# Patient Record
Sex: Female | Born: 1980 | Race: White | Hispanic: No | Smoking: Current every day smoker
Health system: Southern US, Community
[De-identification: ages and names within clinical notes are randomized; demographics above are authoritative.]

## PROBLEM LIST (undated history)

## (undated) HISTORY — PX: TUBAL LIGATION: SHX77

---

## 2002-04-17 ENCOUNTER — Emergency Department (HOSPITAL_COMMUNITY): Admission: EM | Admit: 2002-04-17 | Discharge: 2002-04-17 | Payer: Self-pay | Admitting: Emergency Medicine

## 2002-05-26 ENCOUNTER — Emergency Department (HOSPITAL_COMMUNITY): Admission: EM | Admit: 2002-05-26 | Discharge: 2002-05-26 | Payer: Self-pay | Admitting: Emergency Medicine

## 2004-07-11 ENCOUNTER — Emergency Department (HOSPITAL_COMMUNITY): Admission: EM | Admit: 2004-07-11 | Discharge: 2004-07-11 | Payer: Self-pay | Admitting: Emergency Medicine

## 2007-06-12 ENCOUNTER — Emergency Department (HOSPITAL_COMMUNITY): Admission: EM | Admit: 2007-06-12 | Discharge: 2007-06-12 | Payer: Self-pay | Admitting: Emergency Medicine

## 2009-01-12 ENCOUNTER — Emergency Department (HOSPITAL_COMMUNITY): Admission: EM | Admit: 2009-01-12 | Discharge: 2009-01-12 | Payer: Self-pay | Admitting: Emergency Medicine

## 2009-04-13 ENCOUNTER — Emergency Department (HOSPITAL_COMMUNITY): Admission: EM | Admit: 2009-04-13 | Discharge: 2009-04-13 | Payer: Self-pay | Admitting: Emergency Medicine

## 2009-11-30 ENCOUNTER — Emergency Department (HOSPITAL_COMMUNITY): Admission: EM | Admit: 2009-11-30 | Discharge: 2009-11-30 | Payer: Self-pay | Admitting: Emergency Medicine

## 2010-09-04 ENCOUNTER — Emergency Department (HOSPITAL_COMMUNITY)
Admission: EM | Admit: 2010-09-04 | Discharge: 2010-09-04 | Disposition: A | Discharge status: Home / Self Care | Payer: Self-pay | Attending: Emergency Medicine | Admitting: Emergency Medicine

## 2010-09-04 DIAGNOSIS — L255 Unspecified contact dermatitis due to plants, except food: Secondary | ICD-10-CM | POA: Insufficient documentation

## 2010-09-04 DIAGNOSIS — R21 Rash and other nonspecific skin eruption: Secondary | ICD-10-CM | POA: Insufficient documentation

## 2010-12-24 LAB — DIFFERENTIAL
Basophils Absolute: 0
Basophils Relative: 0
Neutro Abs: 5.5
Neutrophils Relative %: 77

## 2010-12-24 LAB — CBC
MCHC: 34.5
Platelets: 258
RDW: 13.1

## 2011-07-16 ENCOUNTER — Emergency Department (HOSPITAL_COMMUNITY)
Admission: EM | Admit: 2011-07-16 | Discharge: 2011-07-16 | Disposition: A | Discharge status: Home / Self Care | Payer: Self-pay | Attending: Emergency Medicine | Admitting: Emergency Medicine

## 2011-07-16 ENCOUNTER — Encounter (HOSPITAL_COMMUNITY): Payer: Self-pay | Admitting: *Deleted

## 2011-07-16 DIAGNOSIS — J40 Bronchitis, not specified as acute or chronic: Secondary | ICD-10-CM

## 2011-07-16 DIAGNOSIS — R05 Cough: Secondary | ICD-10-CM | POA: Insufficient documentation

## 2011-07-16 DIAGNOSIS — R059 Cough, unspecified: Secondary | ICD-10-CM | POA: Insufficient documentation

## 2011-07-16 DIAGNOSIS — F17200 Nicotine dependence, unspecified, uncomplicated: Secondary | ICD-10-CM | POA: Insufficient documentation

## 2011-07-16 NOTE — Discharge Instructions (Signed)
Bronchitis Bronchitis is the body's way of reacting to injury and/or infection (inflammation) of the bronchi. Bronchi are the air tubes that extend from the windpipe into the lungs. If the inflammation becomes severe, it may cause shortness of breath. CAUSES  Inflammation may be caused by:  A virus.   Germs (bacteria).   Dust.   Allergens.   Pollutants and many other irritants.  The cells lining the bronchial tree are covered with tiny hairs (cilia). These constantly beat upward, away from the lungs, toward the mouth. This keeps the lungs free of pollutants. When these cells become too irritated and are unable to do their job, mucus begins to develop. This causes the characteristic cough of bronchitis. The cough clears the lungs when the cilia are unable to do their job. Without either of these protective mechanisms, the mucus would settle in the lungs. Then you would develop pneumonia. Smoking is a common cause of bronchitis and can contribute to pneumonia. Stopping this habit is the single most important thing you can do to help yourself. TREATMENT   Your caregiver may prescribe an antibiotic if the cough is caused by bacteria. Also, medicines that open up your airways make it easier to breathe. Your caregiver may also recommend or prescribe an expectorant. It will loosen the mucus to be coughed up. Only take over-the-counter or prescription medicines for pain, discomfort, or fever as directed by your caregiver.   Removing whatever causes the problem (smoking, for example) is critical to preventing the problem from getting worse.   Cough suppressants may be prescribed for relief of cough symptoms.   Inhaled medicines may be prescribed to help with symptoms now and to help prevent problems from returning.   For those with recurrent (chronic) bronchitis, there may be a need for steroid medicines.  SEEK IMMEDIATE MEDICAL CARE IF:   During treatment, you develop more pus-like mucus  (purulent sputum).   You have a fever.   Your baby is older than 3 months with a rectal temperature of 102 F (38.9 C) or higher.   Your baby is 3 months old or younger with a rectal temperature of 100.4 F (38 C) or higher.   You become progressively more ill.   You have increased difficulty breathing, wheezing, or shortness of breath.  It is necessary to seek immediate medical care if you are elderly or sick from any other disease. MAKE SURE YOU:   Understand these instructions.   Will watch your condition.   Will get help right away if you are not doing well or get worse.  Document Released: 03/18/2005 Document Revised: 03/07/2011 Document Reviewed: 01/26/2008 Exit[employer] Patient Information 2012 [employer].  Antibiotic Nonuse  Your caregiver felt that the infection or problem was not one that would be helped with an antibiotic. Infections may be caused by viruses or bacteria. Only a caregiver can tell which one of these is the likely cause of an illness. A cold is the most common cause of infection in both adults and children. A cold is a virus. Antibiotic treatment will have no effect on a viral infection. Viruses can lead to many lost days of work caring for sick children and many missed days of school. Children may catch as many as 10 "colds" or "flus" per year during which they can be tearful, cranky, and uncomfortable. The goal of treating a virus is aimed at keeping the ill person comfortable. Antibiotics are medications used to help the body fight bacterial infections. There are   relatively few types of bacteria that cause infections but there are hundreds of viruses. While both viruses and bacteria cause infection they are very different types of germs. A viral infection will typically go away by itself within 7 to 10 days. Bacterial infections may spread or get worse without antibiotic treatment. Examples of bacterial infections are:  Sore throats (like strep throat or  tonsillitis).   Infection in the lung (pneumonia).   Ear and skin infections.  Examples of viral infections are:  Colds or flus.   Most coughs and bronchitis.   Sore throats not caused by Strep.   Runny noses.  It is often best not to take an antibiotic when a viral infection is the cause of the problem. Antibiotics can kill off the helpful bacteria that we have inside our body and allow harmful bacteria to start growing. Antibiotics can cause side effects such as allergies, nausea, and diarrhea without helping to improve the symptoms of the viral infection. Additionally, repeated uses of antibiotics can cause bacteria inside of our body to become resistant. That resistance can be passed onto harmful bacterial. The next time you have an infection it may be harder to treat if antibiotics are used when they are not needed. Not treating with antibiotics allows our own immune system to develop and take care of infections more efficiently. Also, antibiotics will work better for us when they are prescribed for bacterial infections. Treatments for a child that is ill may include:  Give extra fluids throughout the day to stay hydrated.   Get plenty of rest.   Only give your child over-the-counter or prescription medicines for pain, discomfort, or fever as directed by your caregiver.   The use of a cool mist humidifier may help stuffy noses.   Cold medications if suggested by your caregiver.  Your caregiver may decide to start you on an antibiotic if:  The problem you were seen for today continues for a longer length of time than expected.   You develop a secondary bacterial infection.  SEEK MEDICAL CARE IF:  Fever lasts longer than 5 days.   Symptoms continue to get worse after 5 to 7 days or become severe.   Difficulty in breathing develops.   Signs of dehydration develop (poor drinking, rare urinating, dark colored urine).   Changes in behavior or worsening tiredness (listlessness  or lethargy).  Document Released: 05/27/2001 Document Revised: 03/07/2011 Document Reviewed: 11/23/2008 [employer] Patient Information 2012 [employer]. 

## 2011-07-16 NOTE — ED Provider Notes (Signed)
Medical screening examination/treatment/procedure(s) were performed by non-physician practitioner and as supervising physician I was immediately available for consultation/collaboration.  Elesia Pemberton, MD 07/16/11 1457 

## 2011-07-16 NOTE — ED Provider Notes (Signed)
History     CSN: [id number]  Arrival date & time 07/16/11  6962   First MD Initiated Contact with Kendra Huffman 07/16/11 [phone]      Chief Complaint  Kendra Huffman presents with  . Cough    (Consider location/radiation/quality/duration/timing/severity/associated sxs/prior treatment) The history is provided by the Kendra Huffman.  31 y/o F pt with no hx chronic lung disease but a 1ppd smoking habit presents to ED with c/c of "wet" non-prod cough x 2 weeks. Assoc with nasal congestion,sore throat, intermittent mild HA. Today, began to have soreness to the entire chest, worse with deep breathing and palpation. There has been no fever, chills, chest tightness, SOB, abd pain, N/V, or myalgias. Pt reports hx of bronchitis each year that typically resolves spontaneously after several weeks. Was advised by employer to come to ED for evaluation of her ability to work today. Smoking makes symptoms worse.  Has been taking cough syrup with minimal relief.  History reviewed. No pertinent past medical history.  Past Surgical History  Procedure Date  . Cesarean section     x 2    No family history on file.  History  Substance Use Topics  . Smoking status: Current Everyday Smoker -- 1.0 packs/day  . Smokeless tobacco: Not on file  . Alcohol Use: No     Review of Systems 10 systems reviewed and are negative for acute change except as noted in the HPI.  Allergies  Review of Kendra Huffman's allergies indicates not on file.  Home Medications  No current outpatient prescriptions on file.  BP 121/88  Pulse 69  Temp(Src) 98.6 F (37 C) (Oral)  Resp 17  Wt 176 lb (79.833 kg)  SpO2 99%  LMP 07/10/2011  Physical Exam  Nursing note and vitals reviewed. Constitutional: She is oriented to person, place, and time. She appears well-developed and well-nourished. No distress.  HENT:  Head: Normocephalic and atraumatic.  Right Ear: Hearing, tympanic membrane, external ear and ear canal normal.  Left Ear: Hearing,  tympanic membrane, external ear and ear canal normal.  Nose: Right sinus exhibits no maxillary sinus tenderness and no frontal sinus tenderness. Left sinus exhibits no maxillary sinus tenderness and no frontal sinus tenderness.  Mouth/Throat: Uvula is midline, oropharynx is clear and moist and mucous membranes are normal. No uvula swelling.       Purulent nasal discharge seen in both nares.  Eyes: Conjunctivae are normal. Pupils are equal, round, and reactive to light. Right eye exhibits no discharge. Left eye exhibits no discharge.  Neck: Normal range of motion. Neck supple.  Cardiovascular: Normal rate, regular rhythm, normal heart sounds and intact distal pulses.   Pulmonary/Chest: Effort normal and breath sounds normal. No respiratory distress. She has no wheezes. She has no rales.       Mild TTP to bilateral upper chest wall  Abdominal: Soft. Bowel sounds are normal. She exhibits no distension. There is no tenderness.  Musculoskeletal: She exhibits no edema and no tenderness.  Lymphadenopathy:    She has no cervical adenopathy.  Neurological: She is alert and oriented to person, place, and time. No cranial nerve deficit.       GCS 15. Normal speech.  Skin: Skin is warm and dry.  Psychiatric: She has a normal mood and affect.    ED Course  Procedures (including critical care time)  Labs Reviewed - No data to display No results found.  Dx 1: Bronchitis   MDM  AF, VSS. Bronchitis sx x 2 weeks in pt with  no chronic lung disease. VS, examination, and PMH not c/w ACS, pneumonia, pneumothorax, PE. Discussed symptomatic tx with pt. No indication for albuterol administration. She is allergic to all medications with codeine and will continue using OTC cough medication as needed. Advised ibuprofen for discomfort. Return precautions discussed.        Shaaron Adler, PA-C 07/16/11 1610  Shaaron Adler, PA-C 07/16/11 [phone]

## 2011-07-16 NOTE — ED Notes (Signed)
Pt states "I've had a cough for 2 wks but can't cough it up, when I blow my nose, it's yellow/brown, couldn't even smoke a cigarette this morning, my chest feels sore"

## 2011-07-29 ENCOUNTER — Emergency Department (HOSPITAL_COMMUNITY)
Admission: EM | Admit: 2011-07-29 | Discharge: 2011-07-29 | Disposition: A | Discharge status: Home / Self Care | Payer: Self-pay | Attending: Emergency Medicine | Admitting: Emergency Medicine

## 2011-07-29 ENCOUNTER — Encounter (HOSPITAL_COMMUNITY): Payer: Self-pay | Admitting: Emergency Medicine

## 2011-07-29 DIAGNOSIS — F17200 Nicotine dependence, unspecified, uncomplicated: Secondary | ICD-10-CM | POA: Insufficient documentation

## 2011-07-29 DIAGNOSIS — L03116 Cellulitis of left lower limb: Secondary | ICD-10-CM

## 2011-07-29 DIAGNOSIS — L02419 Cutaneous abscess of limb, unspecified: Secondary | ICD-10-CM | POA: Insufficient documentation

## 2011-07-29 DIAGNOSIS — L03119 Cellulitis of unspecified part of limb: Secondary | ICD-10-CM | POA: Insufficient documentation

## 2011-07-29 MED ORDER — AMOXICILLIN 500 MG PO CAPS: 500.0000 mg | ORAL_CAPSULE | Freq: Three times a day (TID) | ORAL | Status: DC

## 2011-07-29 MED ORDER — AMOXICILLIN 500 MG PO CAPS: 500.0000 mg | ORAL_CAPSULE | Freq: Three times a day (TID) | ORAL | Status: AC

## 2011-07-29 NOTE — ED Notes (Signed)
Pt alert, nad, c/o raised reddened area to inner left thigh, 2cm, non open, pinpoint center, reddened area noted, area is not firm, nor warm to touch, pt states area "itches"

## 2011-07-29 NOTE — ED Notes (Signed)
Pt c/o wound to L thigh, pt states wound has dark center with reddened area circling, no known insect bite. Nausea last night.

## 2011-07-29 NOTE — ED Provider Notes (Signed)
History     CSN: [id number]  Arrival date & time 07/29/11  0531   First MD Initiated Contact with Patient 07/29/11 0600      Chief Complaint  Patient presents with  . Wound Check    (Consider location/radiation/quality/duration/timing/severity/associated sxs/prior treatment) HPI Patient presents emergency department with the area of redness to her left upper thigh.  She states that she did not notice any ticks or bugs that would have bitten her.  Patient denies fever, nausea/vomiting, diarrhea, weakness, or headache.  Patient has not tried any thing to alleviate the symptoms.  She states nothing seems to make the area, worse.  She said she noted.  2 upper thigh 2 days ago. History reviewed. No pertinent past medical history.  Past Surgical History  Procedure Date  . Cesarean section     x 2    No family history on file.  History  Substance Use Topics  . Smoking status: Current Everyday Smoker -- 1.0 packs/day  . Smokeless tobacco: Not on file  . Alcohol Use: No    OB History    Grav Para Term Preterm Abortions TAB SAB Ect Mult Living                  Review of Systems All other systems negative except as documented in the HPI. All pertinent positives and negatives as reviewed in the HPI.  Allergies  Codeine  Home Medications  No current outpatient prescriptions on file.  BP 130/78  Pulse 82  Temp 98.1 F (36.7 C)  Resp 18  Ht 5\' 5"  (1.651 m)  Wt 174 lb (78.926 kg)  BMI 28.96 kg/m2  SpO2 99%  LMP 07/10/2011  Physical Exam Physical Examination: General appearance - alert, well appearing, and in no distress, oriented to person, place, and time and normal appearing weight Lymphatics - no palpable lymphadenopathy Chest - clear to auscultation, no wheezes, rales or rhonchi, symmetric air entry, no tachypnea, retractions or cyanosis Heart - normal rate, regular rhythm, normal S1, S2, no murmurs, rubs, clicks or gallops Extremities - peripheral pulses normal,  no pedal edema, no clubbing or cyanosis Skin - The patient has a punctate wound in the medial mid L thigh. There is an outer ring of redness noted around the central darker red area.  ED Course  Procedures (including critical care time)  The patient will be treated as a cellulitis versus tick bite. I am going to use amoxicillin for 28 days just to cover for tick bite possibility and since she is self pay doxy was not used. There is no area that appears to be forming an abscess. The central wound looks like there was a bite to this area. There is a 'target" type lesion so therefore the treatment was chosen this way.  The patient is advised to return here in 2 days for a recheck or sooner for any worsening. Told to use heat on the area.   MDM          Carlyle Dolly, PA-C 07/29/11 4098  Carlyle Dolly, PA-C 07/29/11 [phone]

## 2011-07-29 NOTE — Discharge Instructions (Signed)
Return here in 2 days for a recheck or sooner if the area worsens. Use heat on the area as well.

## 2011-07-29 NOTE — ED Provider Notes (Signed)
Medical screening examination/treatment/procedure(s) were performed by non-physician practitioner and as supervising physician I was immediately available for consultation/collaboration.  Olivia Mackie, MD 07/29/11 [phone]

## 2012-12-23 ENCOUNTER — Encounter (HOSPITAL_COMMUNITY): Payer: Self-pay | Admitting: Emergency Medicine

## 2012-12-23 ENCOUNTER — Emergency Department (HOSPITAL_COMMUNITY)
Admission: EM | Admit: 2012-12-23 | Discharge: 2012-12-23 | Disposition: A | Discharge status: Home / Self Care | Payer: Self-pay | Attending: Emergency Medicine | Admitting: Emergency Medicine

## 2012-12-23 DIAGNOSIS — K029 Dental caries, unspecified: Secondary | ICD-10-CM | POA: Insufficient documentation

## 2012-12-23 DIAGNOSIS — K089 Disorder of teeth and supporting structures, unspecified: Secondary | ICD-10-CM | POA: Insufficient documentation

## 2012-12-23 DIAGNOSIS — F17200 Nicotine dependence, unspecified, uncomplicated: Secondary | ICD-10-CM | POA: Insufficient documentation

## 2012-12-23 DIAGNOSIS — K0889 Other specified disorders of teeth and supporting structures: Secondary | ICD-10-CM

## 2012-12-23 MED ORDER — PENICILLIN V POTASSIUM 500 MG PO TABS: 500.0000 mg | ORAL_TABLET | Freq: Four times a day (QID) | ORAL | Status: DC

## 2012-12-23 NOTE — ED Provider Notes (Signed)
CSN: [id number]     Arrival date & time 12/23/12  4098 History   First MD Initiated Contact with Patient 12/23/12 [phone]     Chief Complaint  Patient presents with  . Dental Pain    Left Upper   (Consider location/radiation/quality/duration/timing/severity/associated sxs/prior Treatment) Patient is a 32 y.o. female presenting with tooth pain. The history is provided by the patient. No language interpreter was used.  Dental Pain Location:  Upper Upper teeth location:  14/LU 1st molar and 15/LU 2nd molar Quality:  Aching and throbbing Severity:  Moderate Onset quality:  Gradual Duration:  5 days Timing:  Intermittent Progression:  Worsening (x 3 days) Chronicity:  New Context: dental fracture and poor dentition   Context: not abscess and not trauma   Relieved by:  NSAIDs Worsened by:  Pressure Associated symptoms: facial swelling (Mild)   Associated symptoms: no difficulty swallowing, no drooling, no facial pain, no fever, no gum swelling, no neck pain, no oral bleeding, no oral lesions and no trismus   Risk factors: lack of dental care, periodontal disease and smoking     History reviewed. No pertinent past medical history. Past Surgical History  Procedure Laterality Date  . Cesarean section      x 2   History reviewed. No pertinent family history. History  Substance Use Topics  . Smoking status: Current Every Day Smoker -- 0.50 packs/day    Types: Cigarettes  . Smokeless tobacco: Not on file  . Alcohol Use: No   OB History   Grav Para Term Preterm Abortions TAB SAB Ect Mult Living                 Review of Systems  Constitutional: Negative for fever.  HENT: Positive for facial swelling (Mild) and dental problem (Pain). Negative for ear pain, drooling, mouth sores, trouble swallowing and neck pain.   Respiratory: Negative for shortness of breath.   Gastrointestinal: Negative for nausea and vomiting.  All other systems reviewed and are negative.    Allergies    Codeine  Home Medications   Current Outpatient Rx  Name  Route  Sig  Dispense  Refill  . ibuprofen (ADVIL,MOTRIN) 200 MG tablet   Oral   Take 800 mg by mouth every 6 (six) hours as needed for pain.          Marland Kitchen penicillin v potassium (VEETID) 500 MG tablet   Oral   Take 1 tablet (500 mg total) by mouth 4 (four) times daily.   40 tablet   0    BP 133/80  Pulse 87  Temp(Src) 98.1 F (36.7 C) (Oral)  Resp 20  Ht 5\' 3"  (1.6 m)  Wt 170 lb (77.111 kg)  BMI 30.12 kg/m2  SpO2 100%  LMP 11/24/2012  Physical Exam  Nursing note and vitals reviewed. Constitutional: She is oriented to person, place, and time. She appears well-developed and well-nourished. No distress.  HENT:  Head: Normocephalic and atraumatic.  Right Ear: Tympanic membrane, external ear and ear canal normal. No mastoid tenderness.  Left Ear: Tympanic membrane, external ear and ear canal normal. No mastoid tenderness.  Nose: Nose normal.  Mouth/Throat: Uvula is midline, oropharynx is clear and moist and mucous membranes are normal. No trismus in the jaw. Abnormal dentition. Dental caries present. No dental abscesses. No oropharyngeal exudate or tonsillar abscesses.    Tenderness to palpation of the left upper first and second molars with dental caries and dental fracture to second left upper molar. No gingival  fluctuance or swelling appreciated. Airway patent and patient tolerating secretions without difficulty.  Eyes: Conjunctivae and EOM are normal. Pupils are equal, round, and reactive to light. No scleral icterus.  Neck: Normal range of motion. Neck supple.  Cardiovascular: Normal rate and regular rhythm.   Pulmonary/Chest: Effort normal. No stridor. No respiratory distress. She has no wheezes.  Musculoskeletal: Normal range of motion.  Lymphadenopathy:    She has no cervical adenopathy.  Neurological: She is alert and oriented to person, place, and time.  Skin: Skin is warm and dry. No rash noted. She is not  diaphoretic. No erythema. No pallor.  Psychiatric: She has a normal mood and affect. Her behavior is normal.    ED Course  Procedures (including critical care time) Labs Review Labs Reviewed - No data to display Imaging Review No results found.  MDM   1. Pain, dental    Uncomplicated dental pain. Patient swelling nontoxic-appearing, hemodynamically stable, and afebrile. No gingival swelling or fluctuance to suspect drainable dental abscess. Uvula midline without evidence of peritonsillar abscess. No facial swelling appreciated on physical exam. Patient tolerating secretions without difficulty and airway patent. No red flags or signs concerning for Ludwig angina. Patient appropriate for discharge with dental followup for further evaluation of symptoms. Have recommended ibuprofen for pain control and have prescribed penicillin to cover for infection. Return precautions discussed and patient agreeable to plan with no unaddressed concerns.    Antony Madura, PA-C 12/23/12 1610  Medical screening examination/treatment/procedure(s) were conducted as a shared visit with non-physician practitioner(s) or resident  and myself.  I personally evaluated the patient during the encounter and agree with the findings and plan unless otherwise indicated.    Recurrent dental pain.  Fractured tooth on exam, no swelling/ abscess or neck concerns. Supple neck, poor dentition, no submandibular swelling. Close fup discussed.   Enid Skeens, MD 12/23/12 [phone]

## 2012-12-23 NOTE — ED Notes (Signed)
Patient c/o L upper dental pain x2 teeth. Patient states pain started on Friday. Patient has been taking ibuprofen at home (@800mg  Q6 hours) patient states this does not seem to be helping at all.

## 2012-12-23 NOTE — ED Notes (Addendum)
Patient reports Left upper dental pain. States there are 2 teeth bothering her. Pain started Friday, now has swelling to Left face that started on Sunday. Patient states she is now having discomfort with swallowing. Able to swallow liquids. Patient attempted to use home dental repair kit. Denies fever, denies SHOB, denies vomiting.

## 2013-05-11 ENCOUNTER — Encounter (HOSPITAL_COMMUNITY): Payer: Self-pay | Admitting: Emergency Medicine

## 2013-05-11 ENCOUNTER — Emergency Department (HOSPITAL_COMMUNITY)
Admission: EM | Admit: 2013-05-11 | Discharge: 2013-05-11 | Disposition: A | Discharge status: Home / Self Care | Payer: BC Managed Care – PPO | Attending: Emergency Medicine | Admitting: Emergency Medicine

## 2013-05-11 DIAGNOSIS — R221 Localized swelling, mass and lump, neck: Secondary | ICD-10-CM

## 2013-05-11 DIAGNOSIS — K029 Dental caries, unspecified: Secondary | ICD-10-CM

## 2013-05-11 DIAGNOSIS — K0381 Cracked tooth: Secondary | ICD-10-CM | POA: Insufficient documentation

## 2013-05-11 DIAGNOSIS — R220 Localized swelling, mass and lump, head: Secondary | ICD-10-CM | POA: Insufficient documentation

## 2013-05-11 DIAGNOSIS — K047 Periapical abscess without sinus: Secondary | ICD-10-CM | POA: Insufficient documentation

## 2013-05-11 DIAGNOSIS — F17200 Nicotine dependence, unspecified, uncomplicated: Secondary | ICD-10-CM | POA: Insufficient documentation

## 2013-05-11 MED ORDER — PENICILLIN V POTASSIUM 250 MG PO TABS
500.0000 mg | ORAL_TABLET | Freq: Once | ORAL | Status: AC
  Administered 2013-05-11: 500 mg via ORAL
  Filled 2013-05-11: qty 2

## 2013-05-11 MED ORDER — HYDROCODONE-ACETAMINOPHEN 5-325 MG PO TABS: 1.0000 | ORAL_TABLET | ORAL | Status: DC | PRN

## 2013-05-11 MED ORDER — PENICILLIN V POTASSIUM 500 MG PO TABS: 500.0000 mg | ORAL_TABLET | Freq: Three times a day (TID) | ORAL | Status: DC

## 2013-05-11 MED ORDER — HYDROCODONE-ACETAMINOPHEN 5-325 MG PO TABS
1.0000 | ORAL_TABLET | Freq: Once | ORAL | Status: AC
  Administered 2013-05-11: 1 via ORAL
  Filled 2013-05-11: qty 1

## 2013-05-11 NOTE — Discharge Instructions (Signed)
Dental Pain °A tooth ache may be caused by cavities (tooth decay). Cavities expose the nerve of the tooth to air and hot or cold temperatures. It may come from an infection or abscess (also called a boil or furuncle) around your tooth. It is also often caused by dental caries (tooth decay). This causes the pain you are having. °DIAGNOSIS  °Your caregiver can diagnose this problem by exam. °TREATMENT  °· If caused by an infection, it may be treated with medications which kill germs (antibiotics) and pain medications as prescribed by your caregiver. Take medications as directed. °· Only take over-the-counter or prescription medicines for pain, discomfort, or fever as directed by your caregiver. °· Whether the tooth ache today is caused by infection or dental disease, you should see your dentist as soon as possible for further care. °SEEK MEDICAL CARE IF: °The exam and treatment you received today has been provided on an emergency basis only. This is not a substitute for complete medical or dental care. If your problem worsens or new problems (symptoms) appear, and you are unable to meet with your dentist, call or return to this location. °SEEK IMMEDIATE MEDICAL CARE IF:  °· You have a fever. °· You develop redness and swelling of your face, jaw, or neck. °· You are unable to open your mouth. °· You have severe pain uncontrolled by pain medicine. °MAKE SURE YOU:  °· Understand these instructions. °· Will watch your condition. °· Will get help right away if you are not doing well or get worse. °Document Released: 03/18/2005 Document Revised: 06/10/2011 Document Reviewed: 11/04/2007 °ExitCare® Patient Information ©2014 [employer]. ° °Dental Care and Dentist Visits °Dental care supports good overall health. Regular dental visits can also help you avoid dental pain, bleeding, infection, and other more serious health problems in the future. It is important to keep the mouth healthy because diseases in the teeth, gums,  and other oral tissues can spread to other areas of the body. Some problems, such as diabetes, heart disease, and pre-term labor have been associated with poor oral health.  °See your dentist every 6 months. If you experience emergency problems such as a toothache or broken tooth, go to the dentist right away. If you see your dentist regularly, you may catch problems early. It is easier to be treated for problems in the early stages.  °WHAT TO EXPECT AT A DENTIST VISIT  °Your dentist will look for many common oral health problems and recommend proper treatment. At your regular dental visit, you can expect: °· Gentle cleaning of the teeth and gums. This includes scraping and polishing. This helps to remove the sticky substance around the teeth and gums (plaque). Plaque forms in the mouth shortly after eating. Over time, plaque hardens on the teeth as tartar. If tartar is not removed regularly, it can cause problems. Cleaning also helps remove stains. °· Periodic X-rays. These pictures of the teeth and supporting bone will help your dentist assess the health of your teeth. °· Periodic fluoride treatments. Fluoride is a natural mineral shown to help strengthen teeth. Fluoride treatment involves applying a fluoride gel or varnish to the teeth. It is most commonly done in children. °· Examination of the mouth, tongue, jaws, teeth, and gums to look for any oral health problems, such as: °· Cavities (dental caries). This is decay on the tooth caused by plaque, sugar, and acid in the mouth. It is best to catch a cavity when it is small. °· Inflammation of the gums   caused by plaque buildup (gingivitis).  Problems with the mouth or malformed or misaligned teeth.  Oral cancer or other diseases of the soft tissues or jaws. KEEP YOUR TEETH AND GUMS HEALTHY For healthy teeth and gums, follow these general guidelines as well as your dentist's specific advice:  Have your teeth professionally cleaned at the dentist every 6  months.  Brush twice daily with a fluoride toothpaste.  Floss your teeth daily.  Ask your dentist if you need fluoride supplements, treatments, or fluoride toothpaste.  Eat a healthy diet. Reduce foods and drinks with added sugar.  Avoid smoking. TREATMENT FOR ORAL HEALTH PROBLEMS If you have oral health problems, treatment varies depending on the conditions present in your teeth and gums.  Your caregiver will most likely recommend good oral hygiene at each visit.  For cavities, gingivitis, or other oral health disease, your caregiver will perform a procedure to treat the problem. This is typically done at a separate appointment. Sometimes your caregiver will refer you to another dental specialist for specific tooth problems or for surgery. SEEK IMMEDIATE DENTAL CARE IF:  You have pain, bleeding, or soreness in the gum, tooth, jaw, or mouth area.  A permanent tooth becomes loose or separated from the gum socket.  You experience a blow or injury to the mouth or jaw area. Document Released: 11/28/2010 Document Revised: 06/10/2011 Document Reviewed: 11/28/2010 [facility] Patient Information 2014 [place]Care, [place]LL[place]. Dental Abscess A dental abscess is a collection of infected fluid (pus) from a bacterial infection in the inner part of the tooth (pulp). It usually occurs at the end of the tooth's root.  CAUSES   Severe tooth decay.  Trauma to the tooth that allows bacteria to enter into the pulp, such as a broken or chipped tooth. SYMPTOMS   Severe pain in and around the infected tooth.  Swelling and redness around the abscessed tooth or in the mouth or face.  Tenderness.  Pus drainage.  Bad breath.  Bitter taste in the mouth.  Difficulty swallowing.  Difficulty opening the mouth.  Nausea.  Vomiting.  Chills.  Swollen neck glands. DIAGNOSIS   A medical and dental history will be taken.  An examination will be performed by tapping on the abscessed tooth.  X-rays  may be taken of the tooth to identify the abscess. TREATMENT The goal of treatment is to eliminate the infection. You may be prescribed antibiotic medicine to stop the infection from spreading. A root canal may be performed to save the tooth. If the tooth cannot be saved, it may be pulled (extracted) and the abscess may be drained.  HOME CARE INSTRUCTIONS  Only take over-the-counter or prescription medicines for pain, fever, or discomfort as directed by your caregiver.  Rinse your mouth (gargle) often with salt water ( tsp salt in 8 oz [250 ml] of warm water) to relieve pain or swelling.  Do not drive after taking pain medicine (narcotics).  Do not apply heat to the outside of your face.  Return to your dentist for further treatment as directed. SEEK MEDICAL CARE IF:  Your pain is not helped by medicine.  Your pain is getting worse instead of better. SEEK IMMEDIATE MEDICAL CARE IF:  You have a fever or persistent symptoms for more than 2 3 days.  You have a fever and your symptoms suddenly get worse.  You have chills or a very bad headache.  You have problems breathing or swallowing.  You have trouble opening your mouth.  You have swelling  in the neck or around the eye. Document Released: 03/18/2005 Document Revised: 12/11/2011 Document Reviewed: 06/26/2010 [facility] Patient Information 2014 [place]ExitCare, [place]LLC.

## 2013-05-11 NOTE — ED Provider Notes (Signed)
Medical screening examination/treatment/procedure(s) were performed by non-physician practitioner and as supervising physician I was immediately available for consultation/collaboration.  Gerhard Munchobert Lizmarie Witters, MD 05/11/13 [phone]

## 2013-05-11 NOTE — ED Provider Notes (Signed)
CSN: [id number]     Arrival date & time 05/11/13  0831 History  This chart was scribed for non-physician practitioner Elpidio Anis, PA-C working with Gerhard Munch, MD by Dorothey Baseman, ED Scribe. This patient was seen in room TR05C/TR05C and the patient's care was started at 9:48 AM.    Chief Complaint  Patient presents with  . Dental Pain   The history is provided by the patient. No language interpreter was used.   HPI Comments: Kendra Huffman is a 33 y.o. female who presents to the Emergency Department complaining of a constant pain to the right, upper dentition onset yesterday. She states that she broke a tooth a while ago, but that the pain did not present until yesterday. She reports some associated swelling to the right side of the face. She reports taking 800 mg ibuprofen at home without relief. Patient has an allergy to codeine. Patient has no other pertinent medical history.   History reviewed. No pertinent past medical history. Past Surgical History  Procedure Laterality Date  . Cesarean section      x 2   No family history on file. History  Substance Use Topics  . Smoking status: Current Every Day Smoker -- 0.50 packs/day    Types: Cigarettes  . Smokeless tobacco: Not on file  . Alcohol Use: No   OB History   Grav Para Term Preterm Abortions TAB SAB Ect Mult Living                 Review of Systems  HENT: Positive for dental problem and facial swelling.   All other systems reviewed and are negative.   Allergies  Codeine  Home Medications   Current Outpatient Rx  Name  Route  Sig  Dispense  Refill  . ibuprofen (ADVIL,MOTRIN) 200 MG tablet   Oral   Take 800 mg by mouth every 6 (six) hours as needed for pain.          Marland Kitchen PRESCRIPTION MEDICATION   Oral   Take 1 tablet by mouth daily. Birth control          Triage Vitals: BP 128/65  Pulse 77  Temp(Src) 98.5 F (36.9 C) (Oral)  Resp 20  Ht 5\' 5"  (1.651 m)  Wt 176 lb 4.8 oz (79.969 kg)  BMI 29.34  kg/m2  SpO2 97%  Physical Exam  Nursing note and vitals reviewed. Constitutional: She is oriented to person, place, and time. She appears well-developed and well-nourished. No distress.  HENT:  Head: Normocephalic and atraumatic.  Severe decay to the right, upper 2nd molar. No pointing abscess. Mild right facial swelling.   Eyes: Conjunctivae are normal.  Neck: Normal range of motion. Neck supple.  Pulmonary/Chest: Effort normal. No respiratory distress.  Abdominal: She exhibits no distension.  Musculoskeletal: Normal range of motion.  Lymphadenopathy:    She has no cervical adenopathy.  Neurological: She is alert and oriented to person, place, and time.  Skin: Skin is warm and dry.  Psychiatric: She has a normal mood and affect. Her behavior is normal.    ED Course  Procedures (including critical care time)  DIAGNOSTIC STUDIES: Oxygen Saturation is 97% on room air, normal by my interpretation.    COORDINATION OF CARE: 9:50 AM- Will discharge patient with antibiotics and pain medication to manage symptoms. Advised patient to follow up with a dentist. Discussed treatment plan with patient at bedside and patient verbalized agreement.     Labs Review Labs Reviewed - No data to  display Imaging Review No results found.  EKG Interpretation   None       MDM   Final diagnoses:  None    1. Dental abscess 2. Dental pain  Patient stable for discharge with abx, pain management. Encouraged dental follow up.  I personally performed the services described in this documentation, which was scribed in my presence. The recorded information has been reviewed and is accurate.      Arnoldo HookerShari A Jaymes Hang, PA-C 05/11/13 1425

## 2013-05-11 NOTE — ED Notes (Signed)
Rt upper tooth pain x 3 days

## 2014-08-20 ENCOUNTER — Emergency Department (HOSPITAL_COMMUNITY)
Admission: EM | Admit: 2014-08-20 | Discharge: 2014-08-20 | Disposition: A | Discharge status: Home / Self Care | Payer: BLUE CROSS/BLUE SHIELD | Attending: Emergency Medicine | Admitting: Emergency Medicine

## 2014-08-20 ENCOUNTER — Encounter (HOSPITAL_COMMUNITY): Payer: Self-pay | Admitting: Emergency Medicine

## 2014-08-20 DIAGNOSIS — Z720 Tobacco use: Secondary | ICD-10-CM | POA: Insufficient documentation

## 2014-08-20 DIAGNOSIS — Z79899 Other long term (current) drug therapy: Secondary | ICD-10-CM | POA: Insufficient documentation

## 2014-08-20 DIAGNOSIS — K047 Periapical abscess without sinus: Secondary | ICD-10-CM | POA: Diagnosis not present

## 2014-08-20 DIAGNOSIS — K088 Other specified disorders of teeth and supporting structures: Secondary | ICD-10-CM | POA: Diagnosis present

## 2014-08-20 MED ORDER — PENICILLIN V POTASSIUM 500 MG PO TABS: 500.0000 mg | ORAL_TABLET | Freq: Three times a day (TID) | ORAL | Status: DC

## 2014-08-20 MED ORDER — PENICILLIN V POTASSIUM 500 MG PO TABS
500.0000 mg | ORAL_TABLET | Freq: Once | ORAL | Status: AC
  Administered 2014-08-20: 500 mg via ORAL
  Filled 2014-08-20: qty 1

## 2014-08-20 NOTE — ED Notes (Signed)
Pt arrived to the ED with a complaint of left upper dental pain.  Pt states she had tooth pain on Friday and since then her left had facial area has swollen.

## 2014-08-20 NOTE — ED Provider Notes (Signed)
CSN: [id number]     Arrival date & time 08/20/14  2058 History  This chart was scribed for a non-physician practitioner, Elpidio Anis, PA-C working with Eber Hong, MD by [place] Peace, ED Scribe. The patient was seen in WTR9/WTR9. The patient's care was started at 10:21 PM.    Chief Complaint  Patient presents with  . Dental Pain      Patient is a 34 y.o. female presenting with tooth pain. The history is provided by the patient. No language interpreter was used.  Dental Pain Associated symptoms: facial swelling     HPI Comments: Kendra Huffman is a 34 y.o. female who presents to the Emergency Department complaining of recurrent upper left-sided dental pain onset Friday with facial swelling. She explains that the filling her tooth came out and has now cracked which is causing her current pain. Pt notes she has tried taking Aleve and Ibuprofen without much relief. Pt adds she is going to call on Monday to make an appointment with a dentist. Allergies to Codeine. Pt is current everyday smoker.     History reviewed. No pertinent past medical history. Past Surgical History  Procedure Laterality Date  . Cesarean section      x 2   History reviewed. No pertinent family history. History  Substance Use Topics  . Smoking status: Current Every Day Smoker -- 0.50 packs/day    Types: Cigarettes  . Smokeless tobacco: Not on file  . Alcohol Use: No   OB History    No data available     Review of Systems  HENT: Positive for dental problem and facial swelling.       Allergies  Codeine  Home Medications   Prior to Admission medications   Medication Sig Start Date End Date Taking? Authorizing Provider  ibuprofen (ADVIL,MOTRIN) 200 MG tablet Take 800 mg by mouth every 6 (six) hours as needed for pain.    Yes Historical Provider, MD  MICROGESTIN FE 1/20 1-20 MG-MCG tablet Take 1 tablet by mouth daily. 07/26/14  Yes Historical Provider, MD  HYDROcodone-acetaminophen (NORCO/VICODIN)  5-325 MG per tablet Take 1-2 tablets by mouth every 4 (four) hours as needed. Patient not taking: Reported on 08/20/2014 05/11/13   Elpidio Anis, PA-C  penicillin v potassium (VEETID) 500 MG tablet Take 1 tablet (500 mg total) by mouth 3 (three) times daily. Patient not taking: Reported on 08/20/2014 05/11/13   Elpidio Anis, PA-C   BP 125/82 mmHg  Pulse 70  Temp(Src) 98.2 F (36.8 C) (Oral)  Resp 20  Ht  (1.651 m)  Wt 161 lb (73.029 kg)  BMI 26.79 kg/m2  SpO2 100%  LMP 07/24/2014 (Approximate) Physical Exam  Constitutional: She is oriented to person, place, and time. She appears well-developed and well-nourished. No distress.  HENT:  Head: Normocephalic and atraumatic.  Left facial swelling adjacent to dental swelling around #13. No pointing abscess along alveolar ridge.   Eyes: Conjunctivae and EOM are normal.  Neck: Neck supple. No tracheal deviation present.  Cardiovascular: Normal rate.   Pulmonary/Chest: Effort normal. No respiratory distress.  Musculoskeletal: Normal range of motion.  Lymphadenopathy:    She has cervical adenopathy.  Neurological: She is alert and oriented to person, place, and time.  Skin: Skin is warm and dry.  Psychiatric: She has a normal mood and affect. Her behavior is normal.  Nursing note and vitals reviewed.   ED Course  Procedures (including critical care time) Labs Review Labs Reviewed - No data to display  Imaging Review No results found.   EKG Interpretation None     Medications - No data to display  10:23 PM- Treatment plan was discussed with patient who verbalizes understanding and agrees.   MDM   Final diagnoses:  None    1. Dental abscess  Patient obvious dental abscess or who reports ibuprofen/aleve partially controlling pain. Recommend adding Tylenol. Will provide Penicillin Rx and encourage dental follow up.   I personally performed the services described in this documentation, which was scribed in my presence.  The recorded information has been reviewed and is accurate.     Elpidio AnisShari Jaeshaun Riva, PA-C 08/20/14 [phone]  Eber HongBrian Miller, MD 08/21/14 [phone]

## 2014-08-20 NOTE — Discharge Instructions (Signed)
FOLLOW UP WITH DENTISTRY AS PLANNED. TAKE PENICILLIN AS DIRECTED AND TAKE TYLENOL AND IBUPROFEN TO CONTROL PAIN.    Dental Abscess A dental abscess is a collection of infected fluid (pus) from a bacterial infection in the inner part of the tooth (pulp). It usually occurs at the end of the tooth's root.  CAUSES   Severe tooth decay.  Trauma to the tooth that allows bacteria to enter into the pulp, such as a broken or chipped tooth. SYMPTOMS   Severe pain in and around the infected tooth.  Swelling and redness around the abscessed tooth or in the mouth or face.  Tenderness.  Pus drainage.  Bad breath.  Bitter taste in the mouth.  Difficulty swallowing.  Difficulty opening the mouth.  Nausea.  Vomiting.  Chills.  Swollen neck glands. DIAGNOSIS   A medical and dental history will be taken.  An examination will be performed by tapping on the abscessed tooth.  X-rays may be taken of the tooth to identify the abscess. TREATMENT The goal of treatment is to eliminate the infection. You may be prescribed antibiotic medicine to stop the infection from spreading. A root canal may be performed to save the tooth. If the tooth cannot be saved, it may be pulled (extracted) and the abscess may be drained.  HOME CARE INSTRUCTIONS  Only take over-the-counter or prescription medicines for pain, fever, or discomfort as directed by your caregiver.  Rinse your mouth (gargle) often with salt water ( tsp salt in 8 oz [250 ml] of warm water) to relieve pain or swelling.  Do not drive after taking pain medicine (narcotics).  Do not apply heat to the outside of your face.  Return to your dentist for further treatment as directed. SEEK MEDICAL CARE IF:  Your pain is not helped by medicine.  Your pain is getting worse instead of better. SEEK IMMEDIATE MEDICAL CARE IF:  You have a fever or persistent symptoms for more than 2-3 days.  You have a fever and your symptoms suddenly get  worse.  You have chills or a very bad headache.  You have problems breathing or swallowing.  You have trouble opening your mouth.  You have swelling in the neck or around the eye. Document Released: 03/18/2005 Document Revised: 12/11/2011 Document Reviewed: 06/26/2010 [facility] Patient Information 2015 [place], [place]. This information is not intended to replace advice given to you by your health care provider. Make sure you discuss any questions you have with your health care provider. Heat Therapy Heat therapy can help ease sore, stiff, injured, and tight muscles and joints. Heat relaxes your muscles, which may help ease your pain.  RISKS AND COMPLICATIONS If you have any of the following conditions, do not use heat therapy unless your health care provider has approved:  Poor circulation.  Healing wounds or scarred skin in the area being treated.  Diabetes, heart disease, or high blood pressure.  Not being able to feel (numbness) the area being treated.  Unusual swelling of the area being treated.  Active infections.  Blood clots.  Cancer.  Inability to communicate pain. This may include young children and people who have problems with their brain function (dementia).  Pregnancy. Heat therapy should only be used on old, pre-existing, or long-lasting (chronic) injuries. Do not use heat therapy on new injuries unless directed by your health care provider. HOW TO USE HEAT THERAPY There are several different kinds of heat therapy, including:  Moist heat pack.  Warm water bath.  Hot  water bottle.  Electric heating pad.  Heated gel pack.  Heated wrap.  Electric heating pad. Use the heat therapy method suggested by your health care provider. Follow your health care provider's instructions on when and how to use heat therapy. GENERAL HEAT THERAPY RECOMMENDATIONS  Do not sleep while using heat therapy. Only use heat therapy while you are awake.  Your skin may turn  pink while using heat therapy. Do not use heat therapy if your skin turns red.  Do not use heat therapy if you have new pain.  High heat or long exposure to heat can cause burns. Be careful when using heat therapy to avoid burning your skin.  Do not use heat therapy on areas of your skin that are already irritated, such as with a rash or sunburn. SEEK MEDICAL CARE IF:  You have blisters, redness, swelling, or numbness.  You have new pain.  Your pain is worse. MAKE SURE YOU:  Understand these instructions.  Will watch your condition.  Will get help right away if you are not doing well or get worse. Document Released: 06/10/2011 Document Revised: 08/02/2013 Document Reviewed: 05/11/2013 [facility] Patient Information 2015 [place] CenterExitCare, [place]LLC. This information is not intended to replace advice given to you by your health care provider. Make sure you discuss any questions you have with your health care provider.

## 2014-09-09 ENCOUNTER — Encounter (HOSPITAL_COMMUNITY): Payer: Self-pay | Admitting: *Deleted

## 2014-09-09 ENCOUNTER — Emergency Department (HOSPITAL_COMMUNITY): Payer: BLUE CROSS/BLUE SHIELD

## 2014-09-09 ENCOUNTER — Emergency Department (HOSPITAL_COMMUNITY)
Admission: EM | Admit: 2014-09-09 | Discharge: 2014-09-09 | Disposition: A | Discharge status: Home / Self Care | Payer: BLUE CROSS/BLUE SHIELD | Attending: Emergency Medicine | Admitting: Emergency Medicine

## 2014-09-09 DIAGNOSIS — S6992XA Unspecified injury of left wrist, hand and finger(s), initial encounter: Secondary | ICD-10-CM

## 2014-09-09 DIAGNOSIS — Y9372 Activity, wrestling: Secondary | ICD-10-CM | POA: Insufficient documentation

## 2014-09-09 DIAGNOSIS — Z720 Tobacco use: Secondary | ICD-10-CM | POA: Insufficient documentation

## 2014-09-09 DIAGNOSIS — Y999 Unspecified external cause status: Secondary | ICD-10-CM | POA: Diagnosis not present

## 2014-09-09 DIAGNOSIS — Z792 Long term (current) use of antibiotics: Secondary | ICD-10-CM | POA: Diagnosis not present

## 2014-09-09 DIAGNOSIS — W2209XA Striking against other stationary object, initial encounter: Secondary | ICD-10-CM | POA: Diagnosis not present

## 2014-09-09 DIAGNOSIS — Z79899 Other long term (current) drug therapy: Secondary | ICD-10-CM | POA: Diagnosis not present

## 2014-09-09 DIAGNOSIS — Y929 Unspecified place or not applicable: Secondary | ICD-10-CM | POA: Insufficient documentation

## 2014-09-09 MED ORDER — NAPROXEN 500 MG PO TABS: 500.0000 mg | ORAL_TABLET | Freq: Two times a day (BID) | ORAL | Status: DC

## 2014-09-09 NOTE — Progress Notes (Signed)
Orthopedic Tech Progress Note Patient Details:  Kendra Huffman 03/31/1981 [id number]  Ortho Devices Type of Ortho Device: Thumb velcro splint Ortho Device/Splint Location: LEFT Ortho Device/Splint Interventions: Application   Asia R Thompson 09/09/2014, 8:07 AM

## 2014-09-09 NOTE — ED Notes (Signed)
Pt. Was rough housing with her kids and smacked her thumb on the left hand against the wall. Swelling to site noted

## 2014-09-09 NOTE — ED Notes (Signed)
Ortho tech paged and responded. 

## 2014-09-09 NOTE — ED Provider Notes (Signed)
CSN: [id number]     Arrival date & time 09/09/14  0610 History   First MD Initiated Contact with Patient 09/09/14 [phone]     Chief Complaint  Patient presents with  . Finger Injury     (Consider location/radiation/quality/duration/timing/severity/associated sxs/prior Treatment) HPI Comments: Patient presents today with a chief complaint of left thumb pain.  She reports that the palmar aspect of her left thumb hit a wall while wrestling around with her son last evening.  She has had constant pain since that time.  She has not taken anything for pain prior to arrival.  She also reports associated swelling.  She reports that the pain is minimal at rest, but becomes significantly worse with movement.  She denies numbness or tingling.  The history is provided by the patient.    History reviewed. No pertinent past medical history. Past Surgical History  Procedure Laterality Date  . Cesarean section      x 2   History reviewed. No pertinent family history. History  Substance Use Topics  . Smoking status: Current Every Day Smoker -- 0.50 packs/day    Types: Cigarettes  . Smokeless tobacco: Never Used  . Alcohol Use: No   OB History    No data available     Review of Systems  Constitutional: Negative for fever and chills.  Musculoskeletal:       Left thumb pain  Neurological: Negative for numbness.      Allergies  Codeine  Home Medications   Prior to Admission medications   Medication Sig Start Date End Date Taking? Authorizing Provider  HYDROcodone-acetaminophen (NORCO/VICODIN) 5-325 MG per tablet Take 1-2 tablets by mouth every 4 (four) hours as needed. Patient not taking: Reported on 08/20/2014 05/11/13   Elpidio Anis, PA-C  ibuprofen (ADVIL,MOTRIN) 200 MG tablet Take 800 mg by mouth every 6 (six) hours as needed for pain.     Historical Provider, MD  MICROGESTIN FE 1/20 1-20 MG-MCG tablet Take 1 tablet by mouth daily. 07/26/14   Historical Provider, MD  penicillin v  potassium (VEETID) 500 MG tablet Take 1 tablet (500 mg total) by mouth 3 (three) times daily. 08/20/14   Shari Upstill, PA-C   BP 125/69 mmHg  Pulse 69  Temp(Src) 98.2 F (36.8 C) (Oral)  Resp 17  Ht  (1.651 m)  Wt 155 lb (70.308 kg)  BMI 25.79 kg/m2  SpO2 99%  LMP 07/24/2014 (Approximate) Physical Exam  Constitutional: She appears well-developed and well-nourished.  HENT:  Head: Normocephalic and atraumatic.  Cardiovascular: Normal rate, regular rhythm and normal heart sounds.   Pulmonary/Chest: Effort normal and breath sounds normal.  Musculoskeletal:       Left wrist: She exhibits normal range of motion, no tenderness, no bony tenderness and no swelling.  Tenderness to palpation and mild swelling of the base of the left thumb.  ROM limited secondary to pain.  No snuffbox tenderness.  Neurological: She is alert.  Distal sensation of the left thumb intact  Skin: Skin is warm and dry.  Psychiatric: She has a normal mood and affect.  Nursing note and vitals reviewed.   ED Course  Procedures (including critical care time) Labs Review Labs Reviewed - No data to display  Imaging Review Dg Hand Complete Left  09/09/2014   CLINICAL DATA:  Hit hand on wall.  EXAM: LEFT HAND - COMPLETE 3+ VIEW  COMPARISON:  None.  FINDINGS: There is no evidence of fracture or dislocation. There is no evidence of arthropathy or  other focal bone abnormality. Soft tissues are unremarkable.  IMPRESSION: Negative.   Electronically Signed   By: Ellery Plunk M.D.   On: 09/09/2014 06:53     EKG Interpretation None      MDM   Final diagnoses:  None   Patient presents today with left thumb pain that has been present after hitting her thumb on a wall last evening.  Xray negative.  Patient neurovascularly intact.  Patient given velcro thumb spica.  Stable for discharge.  Return precautions given.    Santiago Glad, PA-C 09/09/14 3790  Loren Racer, MD 09/10/14 [phone]

## 2015-10-31 IMAGING — CR DG HAND COMPLETE 3+V*L*
3 series · 3 of 3 positions shown · non-contrast
Comparison: None.

CLINICAL DATA: Hit hand on wall.

EXAM:
LEFT HAND - COMPLETE 3+ VIEW

[hand pa]
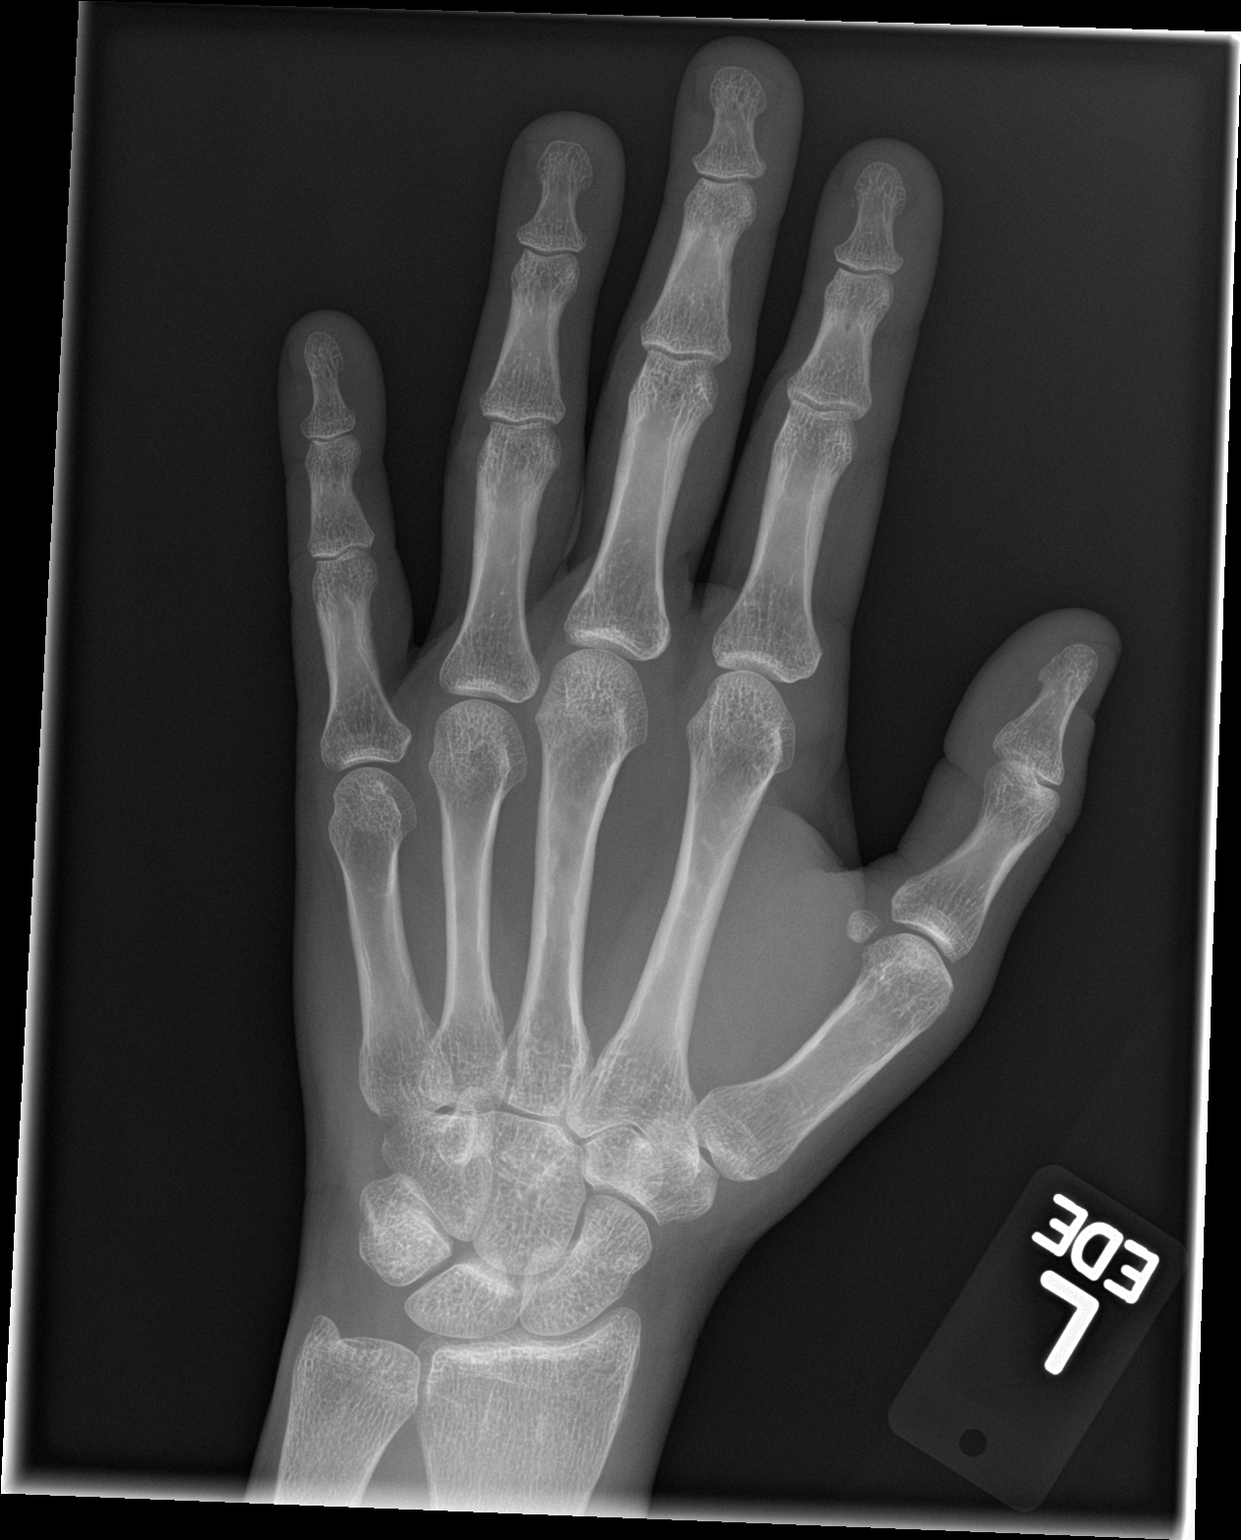

[hand obl]
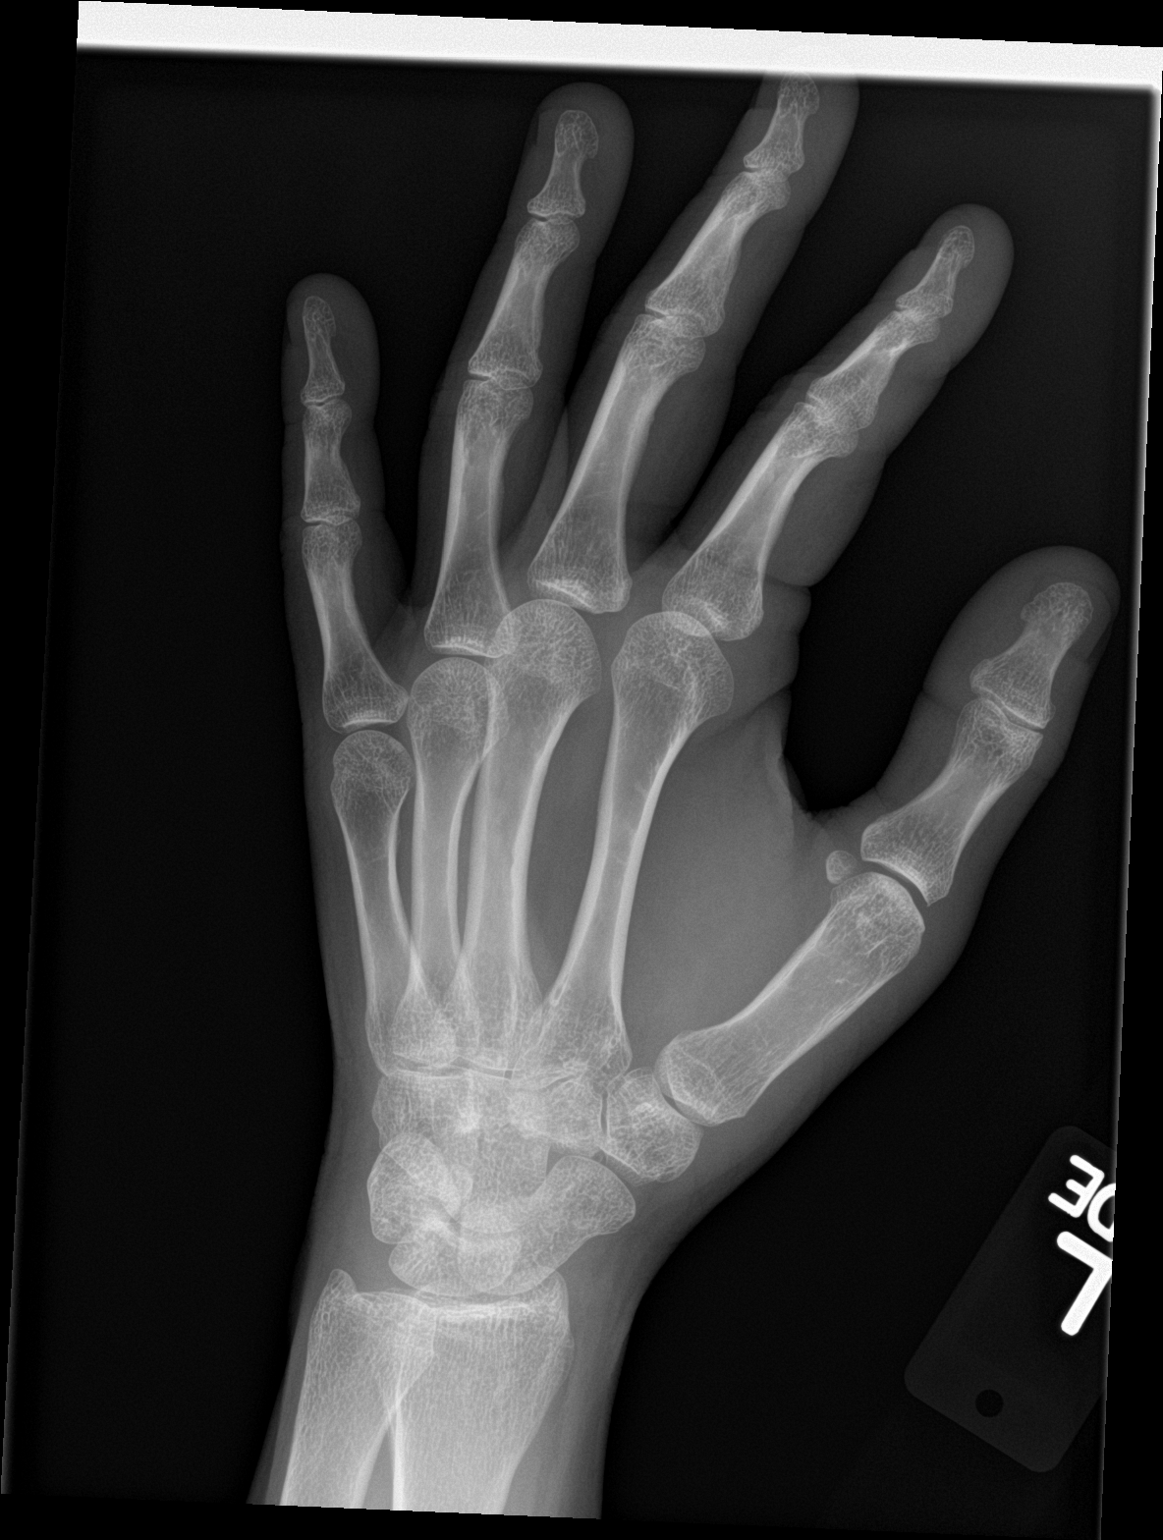

[hand lat]
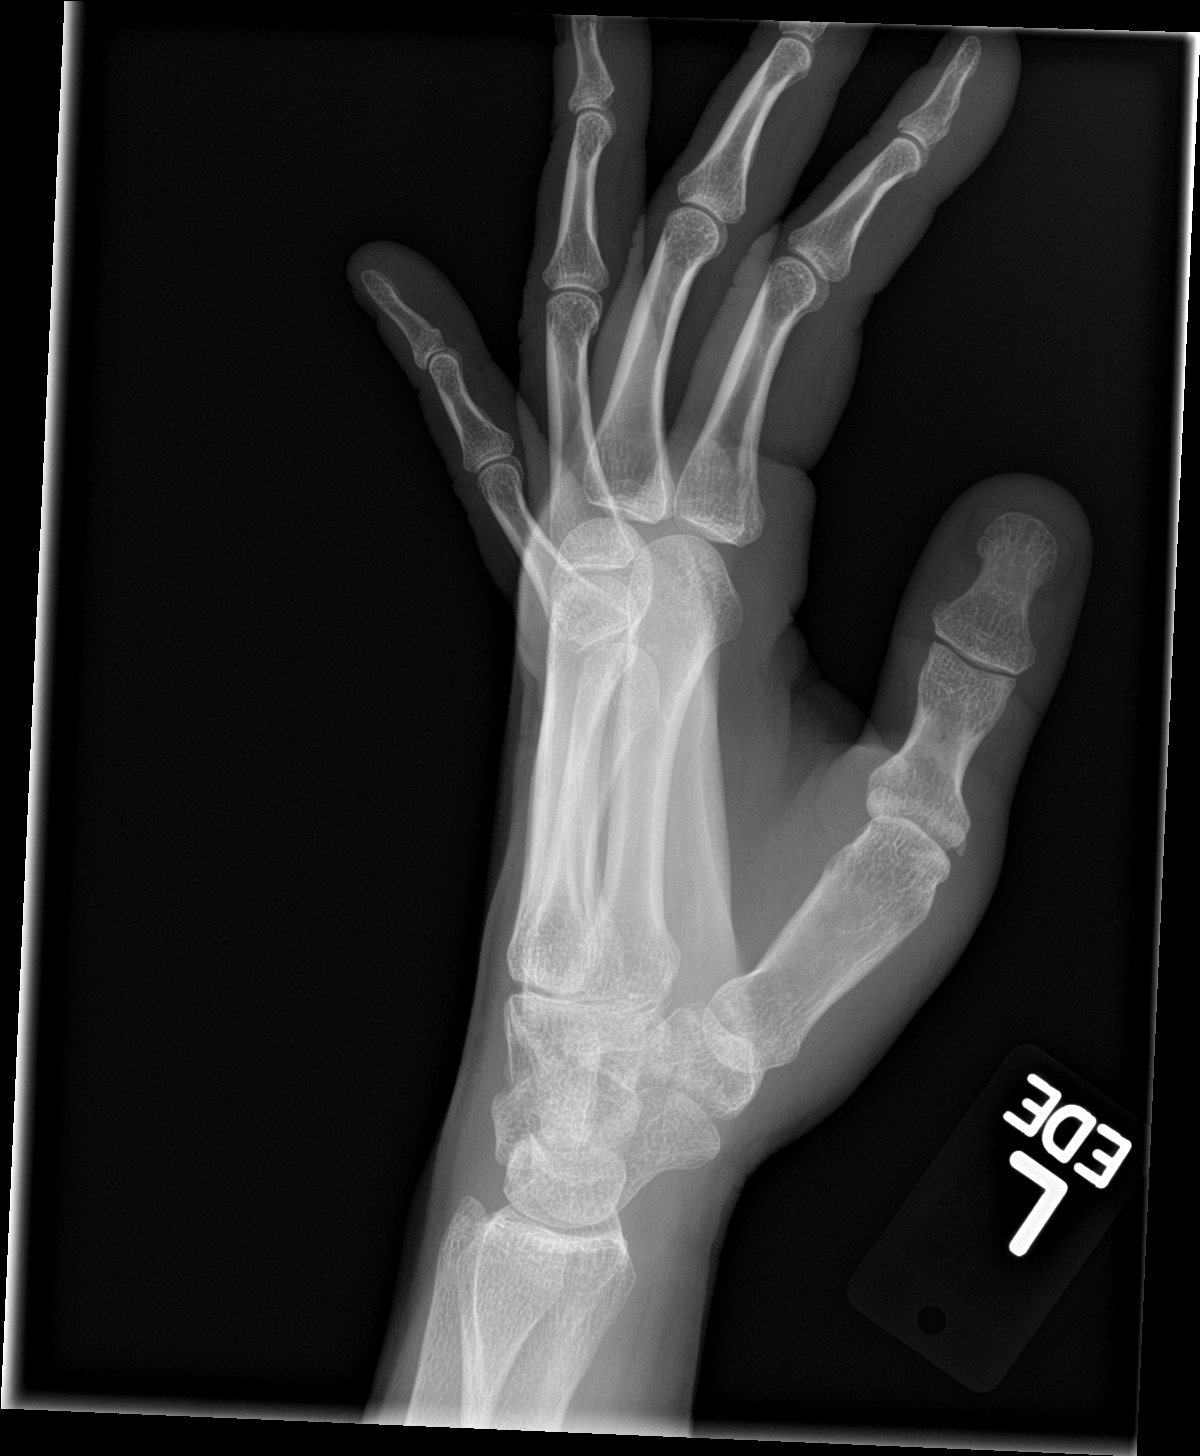

[3 of 3 positions shown; findings below may reference images not displayed]

FINDINGS: There is no evidence of fracture or dislocation. There is no
evidence of arthropathy or other focal bone abnormality. Soft
tissues are unremarkable.
IMPRESSION: Negative.

## 2016-12-11 ENCOUNTER — Emergency Department (HOSPITAL_COMMUNITY)
Admission: EM | Admit: 2016-12-11 | Discharge: 2016-12-11 | Disposition: A | Discharge status: Home / Self Care | Payer: BLUE CROSS/BLUE SHIELD | Attending: Emergency Medicine | Admitting: Emergency Medicine

## 2016-12-11 ENCOUNTER — Encounter (HOSPITAL_COMMUNITY): Payer: Self-pay | Admitting: Family Medicine

## 2016-12-11 DIAGNOSIS — J069 Acute upper respiratory infection, unspecified: Secondary | ICD-10-CM | POA: Insufficient documentation

## 2016-12-11 DIAGNOSIS — J029 Acute pharyngitis, unspecified: Secondary | ICD-10-CM | POA: Diagnosis present

## 2016-12-11 DIAGNOSIS — F17210 Nicotine dependence, cigarettes, uncomplicated: Secondary | ICD-10-CM | POA: Diagnosis not present

## 2016-12-11 MED ORDER — PROMETHAZINE-DM 6.25-15 MG/5ML PO SYRP: 5.0000 mL | ORAL_SOLUTION | Freq: Four times a day (QID) | ORAL | 0 refills | Status: DC | PRN

## 2016-12-11 MED ORDER — BENZONATATE 100 MG PO CAPS: 100.0000 mg | ORAL_CAPSULE | Freq: Three times a day (TID) | ORAL | 0 refills | Status: DC

## 2016-12-11 NOTE — ED Triage Notes (Signed)
Patient is complaining of sore throat, cough, and runny nose for the last two day. Unknown of a measured fever.

## 2016-12-11 NOTE — ED Provider Notes (Signed)
[facility]-EMERGENCY DEPT Provider Note   CSN: [id number] Arrival date & time: 12/11/16  [id number]     History   Chief Complaint No chief complaint on file.   HPI Talmage Napmanda Penza is a 36 y.o. female.  HPI   36 year old female presenting c/o sore throat. For the past 1-2 days pt has a sore throat, mild headache, runny nose and non productive cough.  She report subjective fever. Report recent sick contact with 2 niece/nephew with URI sxs.  Pt is a smoker, no hx of GERD.  No n/v/d, abd pain, cp, sob.  No trouble swallowing.  Has tried OTC meds with some improvement.      No past medical history on file.  There are no active problems to display for this patient.   Past Surgical History:  Procedure Laterality Date  . CESAREAN SECTION     x 2    OB History    No data available       Home Medications    Prior to Admission medications   Medication Sig Start Date End Date Taking? Authorizing Provider  DM-Doxylamine-Acetaminophen (NYQUIL COLD & FLU PO) Take 2 capsules by mouth at bedtime as needed. For cold and flu   Yes [provider]  HYDROcodone-acetaminophen (NORCO/VICODIN) 5-325 MG per tablet Take 1-2 tablets by mouth every 4 (four) hours as needed. Patient not taking: Reported on 08/20/2014 05/11/13   Elpidio AnisUpstill, Shari, PA-C  naproxen (NAPROSYN) 500 MG tablet Take 1 tablet (500 mg total) by mouth 2 (two) times daily. Patient not taking: Reported on 12/11/2016 09/09/14   Santiago GladLaisure, Heather, PA-C  penicillin v potassium (VEETID) 500 MG tablet Take 1 tablet (500 mg total) by mouth 3 (three) times daily. Patient not taking: Reported on 09/09/2014 08/20/14   Elpidio AnisUpstill, Shari, PA-C    Family History No family history on file.  Social History Social History  Substance Use Topics  . Smoking status: Current Every Day Smoker    Packs/day: 0.50    Types: Cigarettes  . Smokeless tobacco: Never Used  . Alcohol use No     Allergies   Codeine   Review of Systems Review of Systems   All other systems reviewed and are negative.    Physical Exam Updated Vital Signs BP 108/76 (BP Location: Right Arm)   Pulse 70   Temp 99 F (37.2 C) (Oral)   Resp 17   Ht 5\' 6"  (1.676 m)   Wt 65.3 kg (144 lb)   SpO2 97%   BMI 23.24 kg/m   Physical Exam  Constitutional: She appears well-developed and well-nourished. No distress.  HENT:  Head: Atraumatic.  Ears: normal TM bilaterally Nose: rhinorrhea Throat: uvula midline, no tonsillar enlargement or exudates  Eyes: Conjunctivae are normal.  Neck: Normal range of motion. Neck supple.  Cardiovascular: Normal rate and regular rhythm.   Pulmonary/Chest: Effort normal and breath sounds normal. She has no wheezes. She has no rales.  Abdominal: Soft. There is no tenderness.  Lymphadenopathy:    She has no cervical adenopathy.  Neurological: She is alert.  Skin: No rash noted.  Psychiatric: She has a normal mood and affect.  Nursing note and vitals reviewed.    ED Treatments / Results  Labs (all labs ordered are listed, but only abnormal results are displayed) Labs Reviewed - No data to display  EKG  EKG Interpretation None       Radiology No results found.  Procedures Procedures (including critical care time)  Medications Ordered in  ED Medications - No data to display   Initial Impression / Assessment and Plan / ED Course  I have reviewed the triage vital signs and the nursing notes.  Pertinent labs & imaging results that were available during my care of the patient were reviewed by me and considered in my medical decision making (see chart for details).     BP 108/76 (BP Location: Right Arm)   Pulse 70   Temp 99 F (37.2 C) (Oral)   Resp 17   Ht  (1.676 m)   Wt 65.3 kg (144 lb)   SpO2 97%   BMI 23.24 kg/m    Final Clinical Impressions(s) / ED Diagnoses   Final diagnoses:  Acute URI    New Prescriptions New Prescriptions   BENZONATATE (TESSALON) 100 MG CAPSULE    Take 1 capsule  (100 mg total) by mouth every 8 (eight) hours.   PROMETHAZINE-DEXTROMETHORPHAN (PROMETHAZINE-DM) 6.25-15 MG/5ML SYRUP    Take 5 mLs by mouth 4 (four) times daily as needed for cough.   10:48 AM Pt symptoms consistent with URI. Low suspicion for strep pharyngitis based on CENTOR criteria. Pt will be discharged with symptomatic treatment.  Discussed return precautions.  Pt is hemodynamically stable & in NAD prior to discharge.    Fayrene Helper, PA-C 12/11/16 1051    Rolland Porter, MD 12/29/16 2007

## 2016-12-16 ENCOUNTER — Ambulatory Visit (HOSPITAL_BASED_OUTPATIENT_CLINIC_OR_DEPARTMENT_OTHER): Admit: 2016-12-16 | Payer: BLUE CROSS/BLUE SHIELD | Admitting: Specialist

## 2016-12-16 ENCOUNTER — Encounter (HOSPITAL_BASED_OUTPATIENT_CLINIC_OR_DEPARTMENT_OTHER): Payer: Self-pay

## 2016-12-16 SURGERY — LIPOSUCTION
Anesthesia: General | Laterality: Right

## 2017-04-01 HISTORY — PX: LIPOSUCTION: SHX10

## 2021-02-26 ENCOUNTER — Encounter (HOSPITAL_BASED_OUTPATIENT_CLINIC_OR_DEPARTMENT_OTHER): Payer: Self-pay | Admitting: Specialist

## 2021-02-26 ENCOUNTER — Other Ambulatory Visit: Payer: Self-pay

## 2021-02-26 NOTE — Progress Notes (Addendum)
Spoke w/ via phone for pre-op interview---pt Lab needs dos--- cbc cmp , urine  preg poct             Lab results-----none- COVID test -----patient states asymptomatic no test needed Arrive at -------1130 am 03-01-2021 NPO after MN NO Solid Food.  Clear liquids from MN until---10301 am Medications to take morning of surgery -----none Diabetic medication -----n/a Patient instructed no nail polish to be worn day of surgery Patient instructed to bring photo id and insurance card day of surgery Patient aware to have Driver (ride ) / caregiver    for 24 hours after surgery  finace or mother Patient Special Instructions -----none Pre-Op special Istructions -----none Patient verbalized understanding of instructions that were given at this phone interview. Patient denies shortness of breath, chest pain, fever, cough at this phone interview.

## 2021-03-01 ENCOUNTER — Ambulatory Visit (HOSPITAL_BASED_OUTPATIENT_CLINIC_OR_DEPARTMENT_OTHER): Payer: Medicaid Other | Admitting: Anesthesiology

## 2021-03-01 ENCOUNTER — Encounter (HOSPITAL_BASED_OUTPATIENT_CLINIC_OR_DEPARTMENT_OTHER)
Admission: RE | Disposition: A | Discharge status: Home / Self Care | Payer: Self-pay | Source: Home / Self Care | Attending: Specialist

## 2021-03-01 ENCOUNTER — Encounter (HOSPITAL_BASED_OUTPATIENT_CLINIC_OR_DEPARTMENT_OTHER): Payer: Self-pay | Admitting: Specialist

## 2021-03-01 ENCOUNTER — Ambulatory Visit (HOSPITAL_BASED_OUTPATIENT_CLINIC_OR_DEPARTMENT_OTHER)
Admission: RE | Admit: 2021-03-01 | Discharge: 2021-03-01 | Disposition: A | Discharge status: Home / Self Care | Payer: Medicaid Other | Attending: Specialist | Admitting: Specialist

## 2021-03-01 DIAGNOSIS — M7541 Impingement syndrome of right shoulder: Secondary | ICD-10-CM | POA: Diagnosis not present

## 2021-03-01 DIAGNOSIS — X58XXXA Exposure to other specified factors, initial encounter: Secondary | ICD-10-CM | POA: Insufficient documentation

## 2021-03-01 DIAGNOSIS — F17210 Nicotine dependence, cigarettes, uncomplicated: Secondary | ICD-10-CM | POA: Insufficient documentation

## 2021-03-01 DIAGNOSIS — M19011 Primary osteoarthritis, right shoulder: Secondary | ICD-10-CM | POA: Diagnosis not present

## 2021-03-01 DIAGNOSIS — S43431A Superior glenoid labrum lesion of right shoulder, initial encounter: Secondary | ICD-10-CM | POA: Diagnosis not present

## 2021-03-01 DIAGNOSIS — K219 Gastro-esophageal reflux disease without esophagitis: Secondary | ICD-10-CM | POA: Insufficient documentation

## 2021-03-01 DIAGNOSIS — M75101 Unspecified rotator cuff tear or rupture of right shoulder, not specified as traumatic: Secondary | ICD-10-CM | POA: Diagnosis not present

## 2021-03-01 HISTORY — PX: SHOULDER ARTHROSCOPY WITH ROTATOR CUFF REPAIR: SHX5685

## 2021-03-01 LAB — CBC
HCT: 44.3 % (ref 36.0–46.0)
Hemoglobin: 14.7 g/dL (ref 12.0–15.0)
MCH: 33.2 pg (ref 26.0–34.0)
MCHC: 33.2 g/dL (ref 30.0–36.0)
MCV: 100 fL (ref 80.0–100.0)
Platelets: 282 10*3/uL (ref 150–400)
RBC: 4.43 MIL/uL (ref 3.87–5.11)
RDW: 12.2 % (ref 11.5–15.5)
WBC: 10.3 10*3/uL (ref 4.0–10.5)
nRBC: 0 % (ref 0.0–0.2)

## 2021-03-01 LAB — POCT I-STAT, CHEM 8
BUN: 8 mg/dL (ref 6–20)
Calcium, Ion: 1.19 mmol/L (ref 1.15–1.40)
Chloride: 104 mmol/L (ref 98–111)
Creatinine, Ser: 0.3 mg/dL — ABNORMAL LOW (ref 0.44–1.00)
Glucose, Bld: 68 mg/dL — ABNORMAL LOW (ref 70–99)
HCT: 41 % (ref 36.0–46.0)
Hemoglobin: 13.9 g/dL (ref 12.0–15.0)
Potassium: 3.5 mmol/L (ref 3.5–5.1)
Sodium: 140 mmol/L (ref 135–145)
TCO2: 25 mmol/L (ref 22–32)

## 2021-03-01 LAB — POCT PREGNANCY, URINE: Preg Test, Ur: NEGATIVE

## 2021-03-01 SURGERY — ARTHROSCOPY, SHOULDER, WITH ROTATOR CUFF REPAIR
Anesthesia: Regional | Site: Shoulder | Laterality: Right

## 2021-03-01 MED ORDER — GLYCOPYRROLATE PF 0.2 MG/ML IJ SOSY
PREFILLED_SYRINGE | INTRAMUSCULAR | Status: AC
  Filled 2021-03-01: qty 1

## 2021-03-01 MED ORDER — LIDOCAINE 2% (20 MG/ML) 5 ML SYRINGE
INTRAMUSCULAR | Status: AC
  Filled 2021-03-01: qty 5

## 2021-03-01 MED ORDER — DIPHENHYDRAMINE HCL 50 MG/ML IJ SOLN
INTRAMUSCULAR | Status: DC | PRN
  Administered 2021-03-01: 12.5 mg via INTRAVENOUS

## 2021-03-01 MED ORDER — LIDOCAINE 2% (20 MG/ML) 5 ML SYRINGE
INTRAMUSCULAR | Status: DC | PRN
  Administered 2021-03-01: 40 mg via INTRAVENOUS

## 2021-03-01 MED ORDER — ONDANSETRON HCL 4 MG/2ML IJ SOLN
INTRAMUSCULAR | Status: AC
  Filled 2021-03-01: qty 2

## 2021-03-01 MED ORDER — MIDAZOLAM HCL 2 MG/2ML IJ SOLN
2.0000 mg | Freq: Once | INTRAMUSCULAR | Status: AC
  Administered 2021-03-01: 2 mg via INTRAVENOUS

## 2021-03-01 MED ORDER — DIPHENHYDRAMINE HCL 50 MG/ML IJ SOLN
INTRAMUSCULAR | Status: AC
  Filled 2021-03-01: qty 1

## 2021-03-01 MED ORDER — SUCCINYLCHOLINE CHLORIDE 200 MG/10ML IV SOSY
PREFILLED_SYRINGE | INTRAVENOUS | Status: DC | PRN
  Administered 2021-03-01: 100 mg via INTRAVENOUS

## 2021-03-01 MED ORDER — MIDAZOLAM HCL 2 MG/2ML IJ SOLN
INTRAMUSCULAR | Status: AC
  Filled 2021-03-01: qty 2

## 2021-03-01 MED ORDER — FENTANYL CITRATE (PF) 100 MCG/2ML IJ SOLN
INTRAMUSCULAR | Status: AC
  Filled 2021-03-01: qty 2

## 2021-03-01 MED ORDER — PHENYLEPHRINE 40 MCG/ML (10ML) SYRINGE FOR IV PUSH (FOR BLOOD PRESSURE SUPPORT)
PREFILLED_SYRINGE | INTRAVENOUS | Status: AC
  Filled 2021-03-01: qty 10

## 2021-03-01 MED ORDER — TRAMADOL HCL 50 MG PO TABS: 50.0000 mg | ORAL_TABLET | Freq: Four times a day (QID) | ORAL | 0 refills | Status: AC | PRN

## 2021-03-01 MED ORDER — ROCURONIUM BROMIDE 10 MG/ML (PF) SYRINGE
PREFILLED_SYRINGE | INTRAVENOUS | Status: AC
  Filled 2021-03-01: qty 10

## 2021-03-01 MED ORDER — DEXAMETHASONE SODIUM PHOSPHATE 10 MG/ML IJ SOLN
INTRAMUSCULAR | Status: DC | PRN
  Administered 2021-03-01: 10 mg via INTRAVENOUS

## 2021-03-01 MED ORDER — MEPERIDINE HCL 25 MG/ML IJ SOLN
6.2500 mg | INTRAMUSCULAR | Status: DC | PRN
Start: 2021-03-01 — End: 2021-03-01

## 2021-03-01 MED ORDER — BUPIVACAINE-EPINEPHRINE (PF) 0.5% -1:200000 IJ SOLN
INTRAMUSCULAR | Status: DC | PRN
  Administered 2021-03-01: 10 mL via PERINEURAL

## 2021-03-01 MED ORDER — FENTANYL CITRATE (PF) 250 MCG/5ML IJ SOLN
INTRAMUSCULAR | Status: AC
  Filled 2021-03-01: qty 5

## 2021-03-01 MED ORDER — 0.9 % SODIUM CHLORIDE (POUR BTL) OPTIME
TOPICAL | Status: DC | PRN
  Administered 2021-03-01: 500 mL

## 2021-03-01 MED ORDER — GLYCOPYRROLATE PF 0.2 MG/ML IJ SOSY
PREFILLED_SYRINGE | INTRAMUSCULAR | Status: DC | PRN
  Administered 2021-03-01: .2 mg via INTRAVENOUS

## 2021-03-01 MED ORDER — KETOROLAC TROMETHAMINE 30 MG/ML IJ SOLN: 30.0000 mg | Freq: Once | INTRAMUSCULAR | Status: DC | PRN

## 2021-03-01 MED ORDER — DEXAMETHASONE SODIUM PHOSPHATE 10 MG/ML IJ SOLN
INTRAMUSCULAR | Status: AC
  Filled 2021-03-01: qty 1

## 2021-03-01 MED ORDER — PROPOFOL 10 MG/ML IV BOLUS
INTRAVENOUS | Status: DC | PRN
  Administered 2021-03-01: 100 mg via INTRAVENOUS

## 2021-03-01 MED ORDER — DEXMEDETOMIDINE (PRECEDEX) IN NS 20 MCG/5ML (4 MCG/ML) IV SYRINGE
PREFILLED_SYRINGE | INTRAVENOUS | Status: AC
  Filled 2021-03-01: qty 5

## 2021-03-01 MED ORDER — BUPIVACAINE LIPOSOME 1.3 % IJ SUSP
INTRAMUSCULAR | Status: DC | PRN
  Administered 2021-03-01: 10 mL via PERINEURAL

## 2021-03-01 MED ORDER — SUCCINYLCHOLINE CHLORIDE 200 MG/10ML IV SOSY
PREFILLED_SYRINGE | INTRAVENOUS | Status: AC
  Filled 2021-03-01: qty 10

## 2021-03-01 MED ORDER — PHENYLEPHRINE 40 MCG/ML (10ML) SYRINGE FOR IV PUSH (FOR BLOOD PRESSURE SUPPORT)
PREFILLED_SYRINGE | INTRAVENOUS | Status: DC | PRN
  Administered 2021-03-01: 40 ug via INTRAVENOUS
  Administered 2021-03-01: 80 ug via INTRAVENOUS
  Administered 2021-03-01: 40 ug via INTRAVENOUS
  Administered 2021-03-01 (×5): 80 ug via INTRAVENOUS

## 2021-03-01 MED ORDER — OXYCODONE HCL 5 MG PO TABS: 5.0000 mg | ORAL_TABLET | ORAL | 0 refills | Status: AC | PRN

## 2021-03-01 MED ORDER — ONDANSETRON HCL 4 MG PO TABS: 4.0000 mg | ORAL_TABLET | Freq: Every day | ORAL | 1 refills | Status: AC | PRN

## 2021-03-01 MED ORDER — METHOCARBAMOL 500 MG PO TABS: 500.0000 mg | ORAL_TABLET | Freq: Four times a day (QID) | ORAL | 0 refills | Status: AC

## 2021-03-01 MED ORDER — SODIUM CHLORIDE (PF) 0.9 % IJ SOLN
INTRAMUSCULAR | Status: DC | PRN
  Administered 2021-03-01: 14 mL

## 2021-03-01 MED ORDER — CEFAZOLIN SODIUM-DEXTROSE 2-4 GM/100ML-% IV SOLN
INTRAVENOUS | Status: AC
  Filled 2021-03-01: qty 100

## 2021-03-01 MED ORDER — OXYCODONE HCL 5 MG/5ML PO SOLN: 5.0000 mg | Freq: Once | ORAL | Status: DC | PRN

## 2021-03-01 MED ORDER — CEPHALEXIN 500 MG PO CAPS: 500.0000 mg | ORAL_CAPSULE | Freq: Four times a day (QID) | ORAL | 0 refills | Status: AC

## 2021-03-01 MED ORDER — ONDANSETRON HCL 4 MG PO TABS
ORAL_TABLET | ORAL | Status: AC
  Filled 2021-03-01: qty 1

## 2021-03-01 MED ORDER — SCOPOLAMINE 1 MG/3DAYS TD PT72
1.0000 | MEDICATED_PATCH | TRANSDERMAL | Status: DC
  Administered 2021-03-01: 1.5 mg via TRANSDERMAL

## 2021-03-01 MED ORDER — SCOPOLAMINE 1 MG/3DAYS TD PT72
MEDICATED_PATCH | TRANSDERMAL | Status: AC
  Filled 2021-03-01: qty 1

## 2021-03-01 MED ORDER — ROCURONIUM BROMIDE 10 MG/ML (PF) SYRINGE
PREFILLED_SYRINGE | INTRAVENOUS | Status: DC | PRN
  Administered 2021-03-01: 40 mg via INTRAVENOUS

## 2021-03-01 MED ORDER — PROMETHAZINE HCL 25 MG/ML IJ SOLN: 6.2500 mg | INTRAMUSCULAR | Status: DC | PRN

## 2021-03-01 MED ORDER — SODIUM CHLORIDE 0.9 % IR SOLN
Status: DC | PRN
  Administered 2021-03-01: 15000 mL

## 2021-03-01 MED ORDER — SUGAMMADEX SODIUM 200 MG/2ML IV SOLN
INTRAVENOUS | Status: DC | PRN
  Administered 2021-03-01: 120 mg via INTRAVENOUS

## 2021-03-01 MED ORDER — PROPOFOL 10 MG/ML IV BOLUS
INTRAVENOUS | Status: AC
  Filled 2021-03-01: qty 20

## 2021-03-01 MED ORDER — HYDROMORPHONE HCL 1 MG/ML IJ SOLN: 0.2500 mg | INTRAMUSCULAR | Status: DC | PRN

## 2021-03-01 MED ORDER — FENTANYL CITRATE (PF) 100 MCG/2ML IJ SOLN
100.0000 ug | Freq: Once | INTRAMUSCULAR | Status: AC
  Administered 2021-03-01: 100 ug via INTRAVENOUS

## 2021-03-01 MED ORDER — CEFAZOLIN SODIUM-DEXTROSE 2-4 GM/100ML-% IV SOLN
2.0000 g | INTRAVENOUS | Status: AC
  Administered 2021-03-01: 2 g via INTRAVENOUS

## 2021-03-01 MED ORDER — LACTATED RINGERS IV SOLN: INTRAVENOUS | Status: DC

## 2021-03-01 MED ORDER — ONDANSETRON HCL 4 MG/2ML IJ SOLN
INTRAMUSCULAR | Status: DC | PRN
  Administered 2021-03-01: 4 mg via INTRAVENOUS

## 2021-03-01 MED ORDER — OXYCODONE HCL 5 MG PO TABS: 5.0000 mg | ORAL_TABLET | Freq: Once | ORAL | Status: DC | PRN

## 2021-03-01 MED ORDER — FENTANYL CITRATE (PF) 100 MCG/2ML IJ SOLN
INTRAMUSCULAR | Status: DC | PRN
  Administered 2021-03-01 (×2): 50 ug via INTRAVENOUS

## 2021-03-01 SURGICAL SUPPLY — 84 items
ANCH SUT FBRTK 1.3X2.6X1.7 SLF (Anchor) ×1 IMPLANT
ANCH SUT SWLK 19.1X4.75 VT (Anchor) ×1 IMPLANT
ANCHOR FIBERTAK 2.6X1.7 BLUE (Anchor) ×1 IMPLANT
ANCHOR PEEK 4.75X19.1 SWLK C (Anchor) ×1 IMPLANT
BLADE EXCALIBUR 4.0X13 (MISCELLANEOUS) ×2 IMPLANT
BLADE EXCALIBUR 5.0X13 (MISCELLANEOUS) ×2 IMPLANT
BLADE SURG 15 STRL LF DISP TIS (BLADE) IMPLANT
BLADE SURG 15 STRL SS (BLADE)
BNDG COHESIVE 6X5 TAN NS LF (GAUZE/BANDAGES/DRESSINGS) ×1 IMPLANT
BUR SURG 4D 13L RD FLUTE (BUR) IMPLANT
BURR OVAL 8 FLU 5.0X13 (MISCELLANEOUS) ×1 IMPLANT
BURR SURG 4D 13L RD FLUTE (BUR)
CANNULA 5.75X7 CRYSTAL CLEAR (CANNULA) ×2 IMPLANT
CANNULA 5.75X71 LONG (CANNULA) IMPLANT
CANNULA TWIST IN 8.25X7CM (CANNULA) ×2 IMPLANT
CONNECTOR 5 IN 1 STRAIGHT STRL (MISCELLANEOUS) ×2 IMPLANT
DECANTER SPIKE VIAL GLASS SM (MISCELLANEOUS) ×2 IMPLANT
DRAPE ORTHO SPLIT 77X108 STRL (DRAPES) ×4
DRAPE POUCH INSTRU U-SHP 10X18 (DRAPES) ×3 IMPLANT
DRAPE SHEET LG 3/4 BI-LAMINATE (DRAPES) ×2 IMPLANT
DRAPE STERI 35X30 U-POUCH (DRAPES) ×2 IMPLANT
DRAPE SURG 17X23 STRL (DRAPES) ×2 IMPLANT
DRAPE SURG ORHT 6 SPLT 77X108 (DRAPES) ×2 IMPLANT
DRAPE U-SHAPE 47X51 STRL (DRAPES) ×2 IMPLANT
DRSG PAD ABDOMINAL 8X10 ST (GAUZE/BANDAGES/DRESSINGS) ×2 IMPLANT
DURAPREP 26ML APPLICATOR (WOUND CARE) ×2 IMPLANT
DW OUTFLOW CASSETTE/TUBE SET (MISCELLANEOUS) ×2 IMPLANT
ELECT REM PT RETURN 9FT ADLT (ELECTROSURGICAL) ×2
ELECTRODE REM PT RTRN 9FT ADLT (ELECTROSURGICAL) ×1 IMPLANT
FIBER TAPE 2MM (SUTURE) IMPLANT
FIBERSTICK 2 (SUTURE) IMPLANT
GAUZE 4X4 16PLY ~~LOC~~+RFID DBL (SPONGE) ×2 IMPLANT
GAUZE SPONGE 4X4 12PLY STRL (GAUZE/BANDAGES/DRESSINGS) ×2 IMPLANT
GAUZE SPONGE 4X4 12PLY STRL LF (GAUZE/BANDAGES/DRESSINGS) ×1 IMPLANT
GAUZE XEROFORM 1X8 LF (GAUZE/BANDAGES/DRESSINGS) ×2 IMPLANT
GLOVE SRG 8 PF TXTR STRL LF DI (GLOVE) ×1 IMPLANT
GLOVE SURG ENC MOIS LTX SZ7 (GLOVE) ×2 IMPLANT
GLOVE SURG ENC MOIS LTX SZ8 (GLOVE) ×2 IMPLANT
GLOVE SURG UNDER POLY LF SZ7 (GLOVE) ×1 IMPLANT
GLOVE SURG UNDER POLY LF SZ7.5 (GLOVE) ×2 IMPLANT
GLOVE SURG UNDER POLY LF SZ8 (GLOVE) ×2
GOWN STRL REUS W/TWL XL LVL3 (GOWN DISPOSABLE) ×4 IMPLANT
IV NS IRRIG 3000ML ARTHROMATIC (IV SOLUTION) ×9 IMPLANT
KIT TURNOVER CYSTO (KITS) ×2 IMPLANT
LASSO 45 DEG LEFT QUICKPASS (SUTURE) IMPLANT
LASSO QUICK PASS 45DEG CVD RT (SUTURE) IMPLANT
MANIFOLD NEPTUNE II (INSTRUMENTS) ×2 IMPLANT
NDL MAYO 6 CRC TAPER PT (NEEDLE) IMPLANT
NDL MAYO CATGUT SZ4 TPR NDL (NEEDLE) IMPLANT
NDL SCORPION MULTI FIRE (NEEDLE) IMPLANT
NEEDLE HYPO 22GX1.5 SAFETY (NEEDLE) IMPLANT
NEEDLE MAYO 6 CRC TAPER PT (NEEDLE) IMPLANT
NEEDLE MAYO CATGUT SZ4 (NEEDLE) IMPLANT
NEEDLE SCORPION MULTI FIRE (NEEDLE) ×2 IMPLANT
NS IRRIG 500ML POUR BTL (IV SOLUTION) ×1 IMPLANT
PACK ARTHROSCOPY DSU (CUSTOM PROCEDURE TRAY) ×2 IMPLANT
PACK BASIN DAY SURGERY FS (CUSTOM PROCEDURE TRAY) ×2 IMPLANT
PAD ARMBOARD 7.5X6 YLW CONV (MISCELLANEOUS) IMPLANT
PAD CAST 3X4 CTTN HI CHSV (CAST SUPPLIES) IMPLANT
PADDING CAST COTTON 3X4 STRL (CAST SUPPLIES) ×2
PENCIL SMOKE EVACUATOR (MISCELLANEOUS) IMPLANT
PORT APPOLLO RF 90DEGREE MULTI (SURGICAL WAND) ×3 IMPLANT
SLEEVE ARM SUSPENSION SYSTEM (MISCELLANEOUS) ×2 IMPLANT
SLING S3 LATERAL DISP (MISCELLANEOUS) IMPLANT
SLING ULTRA II L (ORTHOPEDIC SUPPLIES) ×2 IMPLANT
SPONGE T-LAP 4X18 ~~LOC~~+RFID (SPONGE) IMPLANT
SUT ETHILON 3 0 PS 1 (SUTURE) ×2 IMPLANT
SUT FIBERWIRE #2 38 T-5 BLUE (SUTURE)
SUT LASSO 90 DEG CVD (SUTURE) IMPLANT
SUT PDS AB 0 CT1 36 (SUTURE) IMPLANT
SUT TIGER TAPE 7 IN WHITE (SUTURE) IMPLANT
SUT VIC AB 0 CT1 36 (SUTURE) IMPLANT
SUT VIC AB 2-0 CT1 27 (SUTURE)
SUT VIC AB 2-0 CT1 TAPERPNT 27 (SUTURE) IMPLANT
SUTURE FIBERWR #2 38 T-5 BLUE (SUTURE) IMPLANT
SUTURE TAPE 1.3 40 TPR END (SUTURE) IMPLANT
SUTURETAPE 1.3 40 TPR END (SUTURE)
SYR CONTROL 10ML LL (SYRINGE) IMPLANT
TAPE FIBER 2MM 7IN #2 BLUE (SUTURE) IMPLANT
TAPE SUT LABRALTAP WHT/BLK (SUTURE) IMPLANT
TOWEL OR 17X26 10 PK STRL BLUE (TOWEL DISPOSABLE) ×2 IMPLANT
TUBE CONNECTING 12X1/4 (SUCTIONS) ×5 IMPLANT
TUBING ARTHROSCOPY IRRIG 16FT (MISCELLANEOUS) ×2 IMPLANT
WATER STERILE IRR 500ML POUR (IV SOLUTION) ×2 IMPLANT

## 2021-03-01 NOTE — Anesthesia Procedure Notes (Signed)
Procedure Name: Intubation Date/Time: 03/01/2021 2:09 PM Performed by: Rogers Blocker, CRNA Pre-anesthesia Checklist: Patient identified, Emergency Drugs available, Suction available and Patient being monitored Patient Re-evaluated:Patient Re-evaluated prior to induction Oxygen Delivery Method: Circle System Utilized Preoxygenation: Pre-oxygenation with 100% oxygen Induction Type: IV induction, Rapid sequence and Cricoid Pressure applied Laryngoscope Size: Mac and 3 Grade View: Grade I Tube type: Oral Tube size: 7.0 mm Number of attempts: 1 Airway Equipment and Method: Stylet Placement Confirmation: ETT inserted through vocal cords under direct vision, positive ETCO2 and breath sounds checked- equal and bilateral Secured at: 22 cm Tube secured with: Tape Dental Injury: Teeth and Oropharynx as per pre-operative assessment

## 2021-03-01 NOTE — Anesthesia Preprocedure Evaluation (Addendum)
Anesthesia Evaluation  Patient identified by MRN, date of birth, ID band Patient awake    Reviewed: Allergy & Precautions, NPO status , Patient's Chart, lab work & pertinent test results  Airway Mallampati: I  TM Distance: >3 FB Neck ROM: Full    Dental  (+) Dental Advisory Given, Loose,    Pulmonary Current SmokerPatient did not abstain from smoking.,  Current every day smoker, 10 pack year history    Pulmonary exam normal breath sounds clear to auscultation       Cardiovascular negative cardio ROS Normal cardiovascular exam Rhythm:Regular Rate:Normal     Neuro/Psych negative neurological ROS  negative psych ROS   GI/Hepatic GERD  Controlled,(+)       marijuana use,   Endo/Other  negative endocrine ROS  Renal/GU negative Renal ROS  negative genitourinary   Musculoskeletal R shoulder rotator cuff tear   Abdominal   Peds  Hematology negative hematology ROS (+)   Anesthesia Other Findings   Reproductive/Obstetrics negative OB ROS                            Anesthesia Physical Anesthesia Plan  ASA: 2  Anesthesia Plan: General and Regional   Post-op Pain Management: Regional block   Induction: Intravenous  PONV Risk Score and Plan: Ondansetron, Dexamethasone, Midazolam, Treatment may vary due to age or medical condition and Scopolamine patch - Pre-op  Airway Management Planned: Oral ETT  Additional Equipment: None  Intra-op Plan:   Post-operative Plan: Extubation in OR  Informed Consent: I have reviewed the patients History and Physical, chart, labs and discussed the procedure including the risks, benefits and alternatives for the proposed anesthesia with the patient or authorized representative who has indicated his/her understanding and acceptance.     Dental advisory given  Plan Discussed with: CRNA  Anesthesia Plan Comments:        Anesthesia Quick  Evaluation

## 2021-03-01 NOTE — Anesthesia Postprocedure Evaluation (Signed)
Anesthesia Post Note  Patient: Kendra Huffman  Procedure(s) Performed: SHOULDER ARTHROSCOPY, SUBACROMIAL DECOMPRESSION, DISTAL CLAVICLE RESECTION, ROTATOR CUFF REPAIR, LABRAL DEBRIDEMENT (Right: Shoulder)     Patient location during evaluation: PACU Anesthesia Type: Regional and General Level of consciousness: sedated Pain management: pain level controlled Vital Signs Assessment: post-procedure vital signs reviewed and stable Respiratory status: spontaneous breathing and respiratory function stable Cardiovascular status: stable Postop Assessment: no apparent nausea or vomiting Anesthetic complications: no   No notable events documented.  Last Vitals:  Vitals:   03/01/21 1612 03/01/21 1614  BP:    Pulse: 61 60  Resp: 18 19  Temp:  (!) 36.3 C  SpO2: 97% 97%    Last Pain:  Vitals:   03/01/21 1614  TempSrc: Oral  PainSc:                  Chalonda Schlatter DANIEL

## 2021-03-01 NOTE — H&P (Signed)
Kendra Huffman is an 40 y.o. female.   Chief Complaint: Right shoulder pain HPI: Pleasant 40 year old female who has been having right shoulder pain for many years. Does not remember an injury to it but does remember pain and dysfunction. She has tried NSAIDS in the past with no relief and believes it is getting worse. She had an MRI scan that showed a rotator cuff tear, ACOA, labral fraying. She has elected to proceed with surgical management at this time.   Past Medical History:  Diagnosis Date   GERD (gastroesophageal reflux disease)    tums prn   Pinched nerve    right wrist has numbness in all fingertips of right hand   Rotator cuff tear arthropathy of right shoulder     Past Surgical History:  Procedure Laterality Date   CESAREAN SECTION     x 2001 and 2005   LIPOSUCTION  2019   on right hip to repair dent in skin   TUBAL LIGATION     4-5 yrs ago per pt on 02-26-2021    History reviewed. No pertinent family history. Social History:  reports that she has been smoking cigarettes. She has a 9.50 pack-year smoking history. She has never used smokeless tobacco. She reports current drug use. Drug: Marijuana. She reports that she does not drink alcohol.  Allergies:  Allergies  Allergen Reactions   Codeine Nausea And Vomiting    Percocet, vicodine, oxycodone,    Medical Adhesive Remover     Small rash goes away soon with  adhesive on ekg    No medications prior to admission.    Results for orders placed or performed during the hospital encounter of 03/01/21 (from the past 48 hour(s))  Pregnancy, urine POC     Status: None   Collection Time: 03/01/21 10:58 AM  Result Value Ref Range   Preg Test, Ur NEGATIVE NEGATIVE    Comment:        THE SENSITIVITY OF THIS METHODOLOGY IS >24 mIU/mL   CBC     Status: None   Collection Time: 03/01/21 11:32 AM  Result Value Ref Range   WBC 10.3 4.0 - 10.5 K/uL   RBC 4.43 3.87 - 5.11 MIL/uL   Hemoglobin 14.7 12.0 - 15.0 g/dL   HCT 48.1  85.6 - 31.4 %   MCV 100.0 80.0 - 100.0 fL   MCH 33.2 26.0 - 34.0 pg   MCHC 33.2 30.0 - 36.0 g/dL   RDW 97.0 26.3 - 78.5 %   Platelets 282 150 - 400 K/uL   nRBC 0.0 0.0 - 0.2 %    Comment: Performed at [facility], [address] [place], [place] [address]  [address]     Status: Abnormal   Collection Time: 03/01/21 12:00 PM  Result Value Ref Range   Sodium 140 135 - 145 mmol/L   Potassium 3.5 3.5 - 5.1 mmol/L   Chloride 104 98 - 111 mmol/L   BUN 8 6 - 20 mg/dL   Creatinine, Ser 7.74 (L) 0.44 - 1.00 mg/dL   Glucose, Bld 68 (L) 70 - 99 mg/dL    Comment: Glucose reference range applies only to samples taken after fasting for at least 8 hours.   Calcium, Ion 1.19 1.15 - 1.40 mmol/L   TCO2 25 22 - 32 mmol/L   Hemoglobin 13.9 12.0 - 15.0 g/dL   HCT 12.8 78.6 - 76.7 %   No results found.  Review of Systems  All  other systems reviewed and are negative.  Blood pressure 116/78, pulse 79, temperature 98 F (36.7 C), temperature source Oral, resp. rate 16, height 5' 6.5" (1.689 m), weight 62 kg, last menstrual period 02/20/2021, SpO2 99 %. Physical Exam Constitutional:      Appearance: Normal appearance.  HENT:     Head: Normocephalic and atraumatic.  Eyes:     Extraocular Movements: Extraocular movements intact.  Musculoskeletal:     Comments: On examination of the right shoulder reveals AC joint is painful with rock maneuvers. Her range of motion is functional. She guards above 90 degrees with abduction, forward flexion. All impingement signs are markedly positive. External rotation, biceps, labral signs are equivocal. Shoulder is stable. Neurologically intact.  Skin:    General: Skin is warm and dry.     Capillary Refill: Capillary refill takes less than 2 seconds.  Neurological:     General: No focal deficit present.     Mental Status: She is alert and oriented to person, place, and time. Mental status is at baseline.  Psychiatric:        Mood and  Affect: Mood normal.        Behavior: Behavior normal.        Thought Content: Thought content normal.        Judgment: Judgment normal.     Assessment/Plan Right shoulder rotator cuff tear, ACOA, labral tearing Patient has tried and failed conservative management and her shoulder is getting worse. We offered her surgical management along with risks and benefits. She has elected to proceed with surgical management at this time. She will be having a right shoulder arthroscopy, SAD, DCR, RCR and labral debridement. All questions encouraged and answered.    Cherie Dark, PA 03/01/2021, 1:34 PM

## 2021-03-01 NOTE — Progress Notes (Signed)
Assisted Dr. Finucane with right, ultrasound guided, interscalene  block. Side rails up, monitors on throughout procedure. See vital signs in flow sheet. Tolerated Procedure well. ?

## 2021-03-01 NOTE — Discharge Instructions (Addendum)
Right Rotator cuff repair: -Please keep dressings on and dry for the next 3 days. Okay to remove them on day 3 and shower. Do not scrub at incision sites. Pat dry with a clean towel and place bandages over incision sites daily. -Leave immobilizer on until follow up appointment. Okay to remove 2-3 times a day and do gentle ROM of wrist and elbow.  -Start PT in 1 week -Prescriptions have been sent to your pharmacy [facility] -It is also recommend that you take additional 100 mg of Tylenol alternating with 800 mg of Ibuprofen q 4-6 hrs and use the pain medication for break through severe pain.  -Follow up in the office in 2 weeks for suture removal.   Post Anesthesia Home Care Instructions  Activity: Get plenty of rest for the remainder of the day. A responsible adult should stay with you for 24 hours following the procedure.  For the next 24 hours, DO NOT: -Drive a car -Advertising copywriter -Drink alcoholic beverages -Take any medication unless instructed by your physician -Make any legal decisions or sign important papers.  Meals: Start with liquid foods such as gelatin or soup. Progress to regular foods as tolerated. Avoid greasy, spicy, heavy foods. If nausea and/or vomiting occur, drink only clear liquids until the nausea and/or vomiting subsides. Call your physician if vomiting continues.  Special Instructions/Symptoms: Your throat may feel dry or sore from the anesthesia or the breathing tube placed in your throat during surgery. If this causes discomfort, gargle with warm salt water. The discomfort should disappear within 24 hours.  If you had a scopolamine patch placed behind your ear for the management of post- operative nausea and/or vomiting:  1. The medication in the patch is effective for 72 hours, after which it should be removed.  Wrap patch in a tissue and discard in the trash. Wash hands thoroughly with soap and water. 2. You may remove the patch earlier than 72 hours if  you experience unpleasant side effects which may include dry mouth, dizziness or visual disturbances. 3. Avoid touching the patch. Wash your hands with soap and water after contact with the patch.   Regional Anesthesia Blocks  1. Numbness or the inability to move the "blocked" extremity may last from 3-48 hours after placement. The length of time depends on the medication injected and your individual response to the medication. If the numbness is not going away after 48 hours, call your surgeon.  2. The extremity that is blocked will need to be protected until the numbness is gone and the  Strength has returned. Because you cannot feel it, you will need to take extra care to avoid injury. Because it may be weak, you may have difficulty moving it or using it. You may not know what position it is in without looking at it while the block is in effect.  3. For blocks in the legs and feet, returning to weight bearing and walking needs to be done carefully. You will need to wait until the numbness is entirely gone and the strength has returned. You should be able to move your leg and foot normally before you try and bear weight or walk. You will need someone to be with you when you first try to ensure you do not fall and possibly risk injury.  4. Bruising and tenderness at the needle site are common side effects and will resolve in a few days.  5. Persistent numbness or new problems with movement should be communicated  to the surgeon or the [facility] [phone] [facility] [phone]). Information for Discharge Teaching: EXPAREL (bupivacaine liposome injectable suspension)   Your surgeon gave you EXPAREL(bupivacaine) in your surgical incision to help control your pain after surgery.  EXPAREL is a local anesthetic that provides pain relief by numbing the tissue around the surgical site. EXPAREL is designed to release pain medication over time and can control pain for up  to 72 hours. Depending on how you respond to EXPAREL, you may require less pain medication during your recovery.  Possible side effects: Temporary loss of sensation or ability to move in the area where bupivacaine was injected. Nausea, vomiting, constipation Rarely, numbness and tingling in your mouth or lips, lightheadedness, or anxiety may occur. Call your doctor right away if you think you may be experiencing any of these sensations, or if you have other questions regarding possible side effects.  Follow all other discharge instructions given to you by your surgeon or nurse. Eat a healthy diet and drink plenty of water or other fluids.  If you return to the hospital for any reason within 96 hours following the administration of EXPAREL, please inform your health care providers.

## 2021-03-01 NOTE — Transfer of Care (Signed)
Immediate Anesthesia Transfer of Care Note  Patient: Kendra Huffman  Procedure(s) Performed: SHOULDER ARTHROSCOPY, SUBACROMIAL DECOMPRESSION, DISTAL CLAVICLE RESECTION, ROTATOR CUFF REPAIR, LABRAL DEBRIDEMENT (Right: Shoulder)  Patient Location: PACU  Anesthesia Type:General and Regional  Level of Consciousness: awake, alert , oriented and patient cooperative  Airway & Oxygen Therapy: Patient Spontanous Breathing and Patient connected to face mask oxygen  Post-op Assessment: Report given to RN and Post -op Vital signs reviewed and stable  Post vital signs: Reviewed and stable  Last Vitals:  Vitals Value Taken Time  BP 116/73 03/01/21 1533  Temp    Pulse 78 03/01/21 1536  Resp 17 03/01/21 1536  SpO2 100 % 03/01/21 1536  Vitals shown include unvalidated device data.  Last Pain:  Vitals:   03/01/21 1114  TempSrc: Oral  PainSc: 4       Patients Stated Pain Goal: 6 (03/01/21 1114)  Complications: No notable events documented.

## 2021-03-01 NOTE — Anesthesia Procedure Notes (Addendum)
Anesthesia Regional Block: Interscalene brachial plexus block   Pre-Anesthetic Checklist: , timeout performed,  Correct Patient, Correct Site, Correct Laterality,  Correct Procedure, Correct Position, site marked,  Risks and benefits discussed,  Surgical consent,  Pre-op evaluation,  At surgeon's request and post-op pain management  Laterality: Right  Prep: Maximum Sterile Barrier Precautions used, chloraprep       Needles:  Injection technique: Single-shot  Needle Type: Echogenic Stimulator Needle     Needle Length: 9cm  Needle Gauge: 22     Additional Needles:   Procedures:,,,, ultrasound used (permanent image in chart),,    Narrative:  Start time: 03/01/2021 1:20 PM End time: 03/01/2021 1:25 PM Injection made incrementally with aspirations every 5 mL.  Performed by: Personally  Anesthesiologist: Lannie Fields, DO  Additional Notes: Monitors applied. No increased pain on injection. No increased resistance to injection. Injection made in 5cc increments. Good needle visualization. Patient tolerated procedure well.

## 2021-03-01 NOTE — Interval H&P Note (Signed)
History and Physical Interval Note:  03/01/2021 1:55 PM  Kendra Huffman  has presented today for surgery, with the diagnosis of Right shoulder rotator cuff tear, acromioclavicular osteroarthritis, labral tear.  The various methods of treatment have been discussed with the patient and family. After consideration of risks, benefits and other options for treatment, the patient has consented to  Procedure(s) with comments: SHOULDER ARTHROSCOPY, SUBACROMIAL DECOMPRESSION, DISTAL CLAVICLE RESECTION, ROTATOR CUFF REPAIR, LABRAL DEBRIDEMENT (Right) - with interscalene block as a surgical intervention.  The patient's history has been reviewed, patient examined, no change in status, stable for surgery.  I have reviewed the patient's chart and labs.  Questions were answered to the patient's satisfaction.     [name]

## 2021-03-01 NOTE — Op Note (Signed)
Preop diagnosis #1 right shoulder rotator cuff tear #2 AC arthritis #3 posterior superior labral tear #4 impingement syndrome Postop diagnosis #1 same supraspinatus tendon #2 same 3 same for same Procedure #1 right shoulder arthroscopic repair of supraspinatus tendon tear #2 arthroscopic subacromial decompression with acromioplasty bursectomy and CA ligament release #3 arthroscopic distal clavicle resection Mumford procedure #4 arthroscopic labral debridement superior posterior labrum type I tear. Surgeon Valma Cava, MD Assistant Harl Favor, PA-C Anesthesia interscalene block with general Estimated blood loss minimal Drains none Complications none Disposition PACU stable.  Operative details Patient was encountered in the holding area correct side identified marked signed appropriately.  IV antibiotics were given within 1 hour of the surgical incision time.  Interscalene block administered by the anesthesiologist.  Taken the operating room placed supine position under general anesthesia.  PAS stockings were applied for DVT prophylaxis.  Turned into a left lateral decubitus position properly padded and bumped.  Right shoulder gently examined functional range of motion.  Prepped with ChloraPrep draped into a sterile fashion.  Utilized the [employer] shoulder holder 30 degrees abduction 30 degrees of forward flexion 10 pounds longitudinal traction and keep the weight as light as possible to not over distract the extremity.  After timeout posterior portal was created arthroscope placed into the glenohumeral joint immediately identifiable was intact biceps anterior inferior inferior posterior labrum type I tearing anterior superior posterior superior biceps intact biceps labral anchor intact.  Glenohumeral ligaments instability normal articular cartilage normal.  Full-thickness rotator cuff tear supraspinatus as is anticipated by the MRI scan.  Anterior portal was made from outside in technique  through the rotator cuff interval shaver was introduced and a labral debridement was performed back to smooth edges.  Smoothed out with cautery.  Shaggy edges of the tear were taken down with a shaver to smooth back to vascular tendon.  Subacromial region was inspected lateral portal was established neurovascular structures were protected throughout the entire case including axillary nerve.  Subacromial subdeltoid bursectomy was performed.  Cautery was utilized to take down the periosteum CA ligament burs and placed posteriorly anterior inferior lateral acromioplasty was performed.  Rotator cuff repair site was prepped and bleeding bone with a bur.Marland Kitchen  Anterior portal was made and utilized for the distal clavicle resection which was found to be markedly osteoarthritic with underlying subclavicular spur.  He was taken back to smooth edges and healthy bone previous superior capsule intact the clavicle was palpated no instability noted.  Hemostasis was obtained.  Small puncture was made superiorly and Arthrex suture anchor placed in the greater tuberosity 4 limbs were sutured in place to the rotator cuff tear of the all tapes.  Arm was abducted sutures placed into the swivel lock for suture bridge double row technique.  The safety suture was then utilized and the anchor was small area posteriorly.  This point time we felt we had excellent tissue down to bleeding bone with excellent repair no complications or problems were noted.  Arthroscopic root was removed taken out of traction.  Portals were closed with 4 nylon suture no complication or problems sterile dressing applied patient shoulder abduction sling turned supine awakened taken from the operating room to PACU stable condition.  Should be stabilized PACU discharged home start therapy next week x-rays in 2 weeks prognosis is excellent.  Patient positioning prepping draping technical surgical assistant throughout entire case wound closure localization of  neurovascular structures and protection Ms. Leanne Hoss, PA-C assist was needed throughout this entire case.

## 2021-03-02 ENCOUNTER — Encounter (HOSPITAL_BASED_OUTPATIENT_CLINIC_OR_DEPARTMENT_OTHER): Payer: Self-pay | Admitting: Specialist
